# Patient Record
Sex: Male | Born: 1990 | Race: White | Hispanic: No | Marital: Single | Smoking: Never smoker
Health system: Southern US, Community
[De-identification: ages and names within clinical notes are randomized; demographics above are authoritative.]

## PROBLEM LIST (undated history)

## (undated) DIAGNOSIS — F32A Depression, unspecified: Secondary | ICD-10-CM

## (undated) DIAGNOSIS — F4310 Post-traumatic stress disorder, unspecified: Secondary | ICD-10-CM

## (undated) DIAGNOSIS — M79606 Pain in leg, unspecified: Secondary | ICD-10-CM

## (undated) DIAGNOSIS — G4700 Insomnia, unspecified: Secondary | ICD-10-CM

## (undated) DIAGNOSIS — F329 Major depressive disorder, single episode, unspecified: Secondary | ICD-10-CM

## (undated) DIAGNOSIS — F988 Other specified behavioral and emotional disorders with onset usually occurring in childhood and adolescence: Secondary | ICD-10-CM

## (undated) DIAGNOSIS — G8929 Other chronic pain: Secondary | ICD-10-CM

## (undated) DIAGNOSIS — F419 Anxiety disorder, unspecified: Secondary | ICD-10-CM

---
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
## 2009-10-11 ENCOUNTER — Ambulatory Visit: Payer: Self-pay | Admitting: Internal Medicine

## 2014-01-11 ENCOUNTER — Telehealth: Payer: Self-pay | Admitting: Internal Medicine

## 2014-01-11 NOTE — Telephone Encounter (Signed)
Have you seen this patient?

## 2014-01-11 NOTE — Telephone Encounter (Signed)
i havent seen this family for years.(last check was  2211 2010) Can contact patient and ask what his needs are  To see if we can fit him in earlier .

## 2014-01-11 NOTE — Telephone Encounter (Signed)
Pt saw md from age 89 to [age] yrs . Pt is requesting md to call him back concerning personal issue. Pt stated he goes [facility]. Pt is aware md does not have new pt appt soon

## 2014-01-11 NOTE — Telephone Encounter (Signed)
Left message at the below listed number for the pt to return my call. 

## 2014-01-13 NOTE — Telephone Encounter (Signed)
Left message at the below listed number for the pt to return my call. 

## 2014-01-15 ENCOUNTER — Emergency Department (HOSPITAL_COMMUNITY)
Admission: EM | Admit: 2014-01-15 | Discharge: 2014-01-15 | Disposition: A | Discharge status: Home / Self Care | Payer: Self-pay | Attending: Emergency Medicine | Admitting: Emergency Medicine

## 2014-01-15 ENCOUNTER — Other Ambulatory Visit: Payer: Self-pay | Admitting: Family Medicine

## 2014-01-15 ENCOUNTER — Encounter (HOSPITAL_COMMUNITY): Payer: Self-pay | Admitting: Emergency Medicine

## 2014-01-15 DIAGNOSIS — F411 Generalized anxiety disorder: Secondary | ICD-10-CM | POA: Insufficient documentation

## 2014-01-15 DIAGNOSIS — Z79899 Other long term (current) drug therapy: Secondary | ICD-10-CM | POA: Insufficient documentation

## 2014-01-15 DIAGNOSIS — F988 Other specified behavioral and emotional disorders with onset usually occurring in childhood and adolescence: Secondary | ICD-10-CM | POA: Insufficient documentation

## 2014-01-15 DIAGNOSIS — Z760 Encounter for issue of repeat prescription: Secondary | ICD-10-CM | POA: Insufficient documentation

## 2014-01-15 DIAGNOSIS — G8929 Other chronic pain: Secondary | ICD-10-CM | POA: Insufficient documentation

## 2014-01-15 HISTORY — DX: Pain in leg, unspecified: M79.606

## 2014-01-15 HISTORY — DX: Post-traumatic stress disorder, unspecified: F43.10

## 2014-01-15 HISTORY — DX: Anxiety disorder, unspecified: F41.9

## 2014-01-15 HISTORY — DX: Other specified behavioral and emotional disorders with onset usually occurring in childhood and adolescence: F98.8

## 2014-01-15 HISTORY — DX: Other chronic pain: G89.29

## 2014-01-15 MED ORDER — CLONAZEPAM 0.5 MG PO TABS: 1.0000 mg | ORAL_TABLET | Freq: Two times a day (BID) | ORAL | Status: AC | PRN

## 2014-01-15 MED ORDER — TEMAZEPAM 30 MG PO CAPS: 30.0000 mg | ORAL_CAPSULE | Freq: Every day | ORAL | Status: AC

## 2014-01-15 MED ORDER — ESZOPICLONE 3 MG PO TABS: 6.0000 mg | ORAL_TABLET | Freq: Every day | ORAL | Status: AC

## 2014-01-15 MED ORDER — METHYLPHENIDATE HCL 10 MG PO TABS: 10.0000 mg | ORAL_TABLET | Freq: Four times a day (QID) | ORAL | Status: AC

## 2014-01-15 NOTE — Telephone Encounter (Signed)
Pt notified.  Sent to [place] to schedule.

## 2014-01-15 NOTE — Discharge Instructions (Signed)
Take your medications as prescribed.  Follow up with your new primary care doctor asap.   You may return to the ER if symptoms worsen or you have any other concerns.

## 2014-01-15 NOTE — ED Notes (Signed)
Pt states he is here for medication refill on xanax, tamazapam, and lunesta and is out of his ADD medication  Ritalin IR  Pt states he has an appt with his dr on the [place]th [place] but is here with his brother on a family emergency

## 2014-01-15 NOTE — Telephone Encounter (Signed)
WP, will see the pt at her next available CPE space (no emergency appointment).  She will NOT be prescribing his controlled substances that were prescribed through the [facility] [employer] [facility].  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  If needed he can be seen in the ED for emergency refills.

## 2014-01-15 NOTE — ED Provider Notes (Signed)
Medical screening examination/treatment/procedure(s) were performed by non-physician practitioner and as supervising physician I was immediately available for consultation/collaboration.    Olivia Mackielga M Klare Criss, MD 01/15/14 [phone]

## 2014-01-15 NOTE — ED Provider Notes (Signed)
CSN: [id number]     Arrival date & time 01/15/14  0309 History   First MD Initiated Contact with Patient 01/15/14 0335     Chief Complaint  Patient presents with  . Medication Refill     (Consider location/radiation/quality/duration/timing/severity/associated sxs/prior Treatment) HPI History provided by pt.   Pt presents w/ request for medication refill.  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  Originally from [place], [place] but has recently separated from wife and moved to [place].  Is currently in [place] visiting his brother.  Has a new PCP in [place] and initial appt scheduled for 01/31/14.  Received 1 week refills of all meds at ER 8 days ago.  He is requesting his lunesta, ativan, ritalin, restoril and percocet.  Has been feeling anxious since most recent dose of ativan wore off, but otherwise feeling well.  Denies HI/SI. Past Medical History  Diagnosis Date  . PTSD (post-traumatic stress disorder)   . Chronic leg pain   . ADD (attention deficit disorder)   . Anxiety    History reviewed. No pertinent past surgical history. History reviewed. No pertinent family history. History  Substance Use Topics  . Smoking status: Never Smoker   . Smokeless tobacco: Not on file  . Alcohol Use: No    Review of Systems  All other systems reviewed and are negative.      Allergies  Review of patient's allergies indicates no known allergies.  Home Medications   Current Outpatient Rx  Name  Route  Sig  Dispense  Refill  . acetaminophen (TYLENOL) 500 MG tablet   Oral   Take 1,000 mg by mouth every 6 (six) hours as needed.         . ALPRAZolam (XANAX) 1 MG tablet   Oral   Take 1 mg by mouth 4 (four) times daily.         . Calcium-Magnesium 333-167 MG TABS   Oral   Take 1 tablet by mouth daily.         . cholecalciferol (VITAMIN D) 1000 UNITS tablet   Oral   Take 1,000 Units by mouth daily.         . DULoxetine  (CYMBALTA) 60 MG capsule   Oral   Take 60 mg by mouth daily.         Marland Kitchen Hawthorne Berry 550 MG CAPS   Oral   Take 1 capsule by mouth daily.         . Methylfol-Methylcob-Acetylcyst (CEREFOLIN NAC PO)   Oral   Take 1 tablet by mouth daily.         . Multiple Vitamin (MULTIVITAMIN WITH MINERALS) TABS tablet   Oral   Take 1 tablet by mouth daily.         Marland Kitchen oxyCODONE-acetaminophen (PERCOCET) 10-325 MG per tablet   Oral   Take 1 tablet by mouth every 4 (four) hours as needed for pain.         . clonazePAM (KLONOPIN) 0.5 MG tablet   Oral   Take 2 tablets (1 mg total) by mouth 2 (two) times daily as needed for anxiety.   8 tablet   0   . Eszopiclone (ESZOPICLONE) 3 MG TABS   Oral   Take 2 tablets (6 mg total) by mouth at bedtime. Take immediately before bedtime   14 tablet   0   . methylphenidate (RITALIN) 10 MG tablet   Oral   Take  1 tablet (10 mg total) by mouth 4 (four) times daily.   28 tablet   0   . temazepam (RESTORIL) 30 MG capsule   Oral   Take 1 capsule (30 mg total) by mouth at bedtime.   7 capsule   0    BP 136/79  Pulse 78  Temp(Src) 98.1 F (36.7 C) (Oral)  Resp 16  SpO2 100% Physical Exam  Nursing note and vitals reviewed. Constitutional: He is oriented to person, place, and time. He appears well-developed and well-nourished. No distress.  HENT:  Head: Normocephalic and atraumatic.  Eyes:  Normal appearance  Neck: Normal range of motion.  Pulmonary/Chest: Effort normal.  Musculoskeletal: Normal range of motion.  Neurological: He is alert and oriented to person, place, and time.  Psychiatric: His behavior is normal.  Anxious appearing.  Tangential speech.    ED Course  Procedures (including critical care time) Labs Review Labs Reviewed - No data to display Imaging Review No results found.  EKG Interpretation   None       MDM   Final diagnoses:  Medication refill    23yo former marine w/ PTSD, anxiety, ADD and chronic  pain presents w/ request for medication refill.  Visiting from [place] where he recently relocated, and first appt w/ new PCP 01/31/14.  Because it is the middle of the night, I can not contact his former physician to verify that he takes these medications, and he uses [facility].  I agreed to give him 8 klonopin (he has alternated between this medication and ativan in the past; usually takes 1mg  qid).  Prescribed 1 week of restoril, lunesta and ritalin as well. Declined request for percocet.  He understands that we do not treat chronic anxiety in the ER and that he will likely not have a benzo refilled if he returns.  He denies SI/HI. 4:29 AM   Otilio Miuatherine E Amyr Sluder, PA-C 01/15/14 [phone]

## 2014-03-17 ENCOUNTER — Ambulatory Visit: Payer: Self-pay | Admitting: Internal Medicine

## 2014-03-17 DIAGNOSIS — Z0289 Encounter for other administrative examinations: Secondary | ICD-10-CM

## 2014-03-22 ENCOUNTER — Emergency Department (HOSPITAL_COMMUNITY): Admission: EM | Admit: 2014-03-22 | Discharge: 2014-03-22 | Disposition: A | Discharge status: Home / Self Care

## 2015-01-06 ENCOUNTER — Emergency Department (HOSPITAL_COMMUNITY)

## 2015-01-06 ENCOUNTER — Encounter (HOSPITAL_COMMUNITY): Payer: Self-pay | Admitting: Emergency Medicine

## 2015-01-06 ENCOUNTER — Emergency Department (HOSPITAL_COMMUNITY)
Admission: EM | Admit: 2015-01-06 | Discharge: 2015-01-06 | Disposition: A | Discharge status: Home / Self Care | Attending: Emergency Medicine | Admitting: Emergency Medicine

## 2015-01-06 DIAGNOSIS — M7989 Other specified soft tissue disorders: Secondary | ICD-10-CM

## 2015-01-06 DIAGNOSIS — S60221A Contusion of right hand, initial encounter: Secondary | ICD-10-CM | POA: Insufficient documentation

## 2015-01-06 DIAGNOSIS — W208XXA Other cause of strike by thrown, projected or falling object, initial encounter: Secondary | ICD-10-CM | POA: Insufficient documentation

## 2015-01-06 DIAGNOSIS — Y998 Other external cause status: Secondary | ICD-10-CM | POA: Insufficient documentation

## 2015-01-06 DIAGNOSIS — F419 Anxiety disorder, unspecified: Secondary | ICD-10-CM | POA: Insufficient documentation

## 2015-01-06 DIAGNOSIS — S6991XA Unspecified injury of right wrist, hand and finger(s), initial encounter: Secondary | ICD-10-CM | POA: Diagnosis present

## 2015-01-06 DIAGNOSIS — Z79899 Other long term (current) drug therapy: Secondary | ICD-10-CM | POA: Diagnosis not present

## 2015-01-06 DIAGNOSIS — G8929 Other chronic pain: Secondary | ICD-10-CM | POA: Insufficient documentation

## 2015-01-06 DIAGNOSIS — Y9389 Activity, other specified: Secondary | ICD-10-CM | POA: Diagnosis not present

## 2015-01-06 DIAGNOSIS — F4310 Post-traumatic stress disorder, unspecified: Secondary | ICD-10-CM | POA: Insufficient documentation

## 2015-01-06 DIAGNOSIS — Y9289 Other specified places as the place of occurrence of the external cause: Secondary | ICD-10-CM | POA: Diagnosis not present

## 2015-01-06 MED ORDER — IBUPROFEN 400 MG PO TABS
600.0000 mg | ORAL_TABLET | Freq: Once | ORAL | Status: AC
  Administered 2015-01-06: 600 mg via ORAL
  Filled 2015-01-06 (×2): qty 1

## 2015-01-06 MED ORDER — IBUPROFEN 600 MG PO TABS
600.0000 mg | ORAL_TABLET | Freq: Four times a day (QID) | ORAL | Status: DC | PRN
Start: 2015-01-06 — End: 2015-01-15

## 2015-01-06 MED ORDER — OXYCODONE-ACETAMINOPHEN 5-325 MG PO TABS
1.0000 | ORAL_TABLET | Freq: Once | ORAL | Status: AC
  Administered 2015-01-06: 1 via ORAL
  Filled 2015-01-06: qty 1

## 2015-01-06 MED ORDER — OXYCODONE-ACETAMINOPHEN 10-325 MG PO TABS: 1.0000 | ORAL_TABLET | ORAL | Status: DC | PRN

## 2015-01-06 NOTE — Discharge Instructions (Signed)
Be sure to keep your hand elevated above your heart to help reduce swelling.  You may remove your arm from your sling to help stretch your elbow and shoulder but make sure your hand is kept elevated as often as possible.  Your should use a pillow at night to help prop it up for comfort and to help with swelling.  You may ice your left hand 3-4 times every hours for 15-20 minutes at a time. Be sure to use a thin cloth between ice and skin to prevent skin damage.  See below for further instructions.   Contusion A contusion is a deep bruise. Contusions are the result of an injury that caused bleeding under the skin. The contusion may turn blue, purple, or yellow. Minor injuries will give you a painless contusion, but more severe contusions may stay painful and swollen for a few weeks.  CAUSES  A contusion is usually caused by a blow, trauma, or direct force to an area of the body. SYMPTOMS   Swelling and redness of the injured area.  Bruising of the injured area.  Tenderness and soreness of the injured area.  Pain. DIAGNOSIS  The diagnosis can be made by taking a history and physical exam. An X-ray, CT scan, or MRI may be needed to determine if there were any associated injuries, such as fractures. TREATMENT  Specific treatment will depend on what area of the body was injured. In general, the best treatment for a contusion is resting, icing, elevating, and applying cold compresses to the injured area. Over-the-counter medicines may also be recommended for pain control. Ask your caregiver what the best treatment is for your contusion. HOME CARE INSTRUCTIONS   Put ice on the injured area.  Put ice in a plastic bag.  Place a towel between your skin and the bag.  Leave the ice on for 15-20 minutes, 3-4 times a day, or as directed by your health care provider.  Only take over-the-counter or prescription medicines for pain, discomfort, or fever as directed by your caregiver. Your caregiver may  recommend avoiding anti-inflammatory medicines (aspirin, ibuprofen, and naproxen) for 48 hours because these medicines may increase bruising.  Rest the injured area.  If possible, elevate the injured area to reduce swelling. SEEK IMMEDIATE MEDICAL CARE IF:   You have increased bruising or swelling.  You have pain that is getting worse.  Your swelling or pain is not relieved with medicines. MAKE SURE YOU:  Cryotherapy Cryotherapy means treatment with cold. Ice or gel packs can be used to reduce both pain and swelling. Ice is the most helpful within the first 24 to 48 hours after an injury or flare-up from overusing a muscle or joint. Sprains, strains, spasms, burning pain, shooting pain, and aches can all be eased with ice. Ice can also be used when recovering from surgery. Ice is effective, has very few side effects, and is safe for most people to use. PRECAUTIONS  Ice is not a safe treatment option for people with:  Raynaud phenomenon. This is a condition affecting small blood vessels in the extremities. Exposure to cold may cause your problems to return.  Cold hypersensitivity. There are many forms of cold hypersensitivity, including:  Cold urticaria. Red, itchy hives appear on the skin when the tissues begin to warm after being iced.  Cold erythema. This is a red, itchy rash caused by exposure to cold.  Cold hemoglobinuria. Red blood cells break down when the tissues begin to warm after being iced.  The hemoglobin that carry oxygen are passed into the urine because they cannot combine with blood proteins fast enough.  Numbness or altered sensitivity in the area being iced. If you have any of the following conditions, do not use ice until you have discussed cryotherapy with your caregiver:  Heart conditions, such as arrhythmia, angina, or chronic heart disease.  High blood pressure.  Healing wounds or open skin in the area being iced.  Current infections.  Rheumatoid  arthritis.  Poor circulation.  Diabetes. Ice slows the blood flow in the region it is applied. This is beneficial when trying to stop inflamed tissues from spreading irritating chemicals to surrounding tissues. However, if you expose your skin to cold temperatures for too long or without the proper protection, you can damage your skin or nerves. Watch for signs of skin damage due to cold. HOME CARE INSTRUCTIONS Follow these tips to use ice and cold packs safely.  Place a dry or damp towel between the ice and skin. A damp towel will cool the skin more quickly, so you may need to shorten the time that the ice is used.  For a more rapid response, add gentle compression to the ice.  Ice for no more than 10 to 20 minutes at a time. The bonier the area you are icing, the less time it will take to get the benefits of ice.  Check your skin after 5 minutes to make sure there are no signs of a poor response to cold or skin damage.  Rest 20 minutes or more between uses.  Once your skin is numb, you can end your treatment. You can test numbness by very lightly touching your skin. The touch should be so light that you do not see the skin dimple from the pressure of your fingertip. When using ice, most people will feel these normal sensations in this order: cold, burning, aching, and numbness.  Do not use ice on someone who cannot communicate their responses to pain, such as small children or people with dementia. HOW TO MAKE AN ICE PACK Ice packs are the most common way to use ice therapy. Other methods include ice massage, ice baths, and cryosprays. Muscle creams that cause a cold, tingly feeling do not offer the same benefits that ice offers and should not be used as a substitute unless recommended by your caregiver. To make an ice pack, do one of the following:  Place crushed ice or a bag of frozen vegetables in a sealable plastic bag. Squeeze out the excess air. Place this bag inside another plastic  bag. Slide the bag into a pillowcase or place a damp towel between your skin and the bag.  Mix 3 parts water with 1 part rubbing alcohol. Freeze the mixture in a sealable plastic bag. When you remove the mixture from the freezer, it will be slushy. Squeeze out the excess air. Place this bag inside another plastic bag. Slide the bag into a pillowcase or place a damp towel between your skin and the bag. SEEK MEDICAL CARE IF:  You develop white spots on your skin. This may give the skin a blotchy (mottled) appearance.  Your skin turns blue or pale.  Your skin becomes waxy or hard.  Your swelling gets worse. MAKE SURE YOU:   Understand these instructions.  Will watch your condition.  Will get help right away if you are not doing well or get worse. Document Released: 07/02/2011 Document Revised: 03/22/2014 Document Reviewed: 07/02/2011 [employer] Patient Information  2015 [employer]. This information is not intended to replace advice given to you by your health care provider. Make sure you discuss any questions you have with your health care provider.   Understand these instructions.  Will watch your condition.  Will get help right away if you are not doing well or get worse. Document Released: 08/15/2005 Document Revised: 11/10/2013 Document Reviewed: 09/10/2011 [facility] Patient Information 2015 [place], [place]. This information is not intended to replace advice given to you by your health care provider. Make sure you discuss any questions you have with your health care provider.

## 2015-01-06 NOTE — ED Notes (Signed)
Pt. accidentally dropped a dumbell at his right hand this evening , presents with swelling / pain .

## 2015-01-06 NOTE — ED Provider Notes (Signed)
CSN: [id number]     Arrival date & time 01/06/15  1846 History  This chart was scribed for non-physician practitioner, Junius FinnerErin O'Malley, PA-C, working with Flint MelterElliott L Wentz, MD, by Bronson CurbJacqueline Melvin, ED Scribe. This patient was seen in room TR06C/TR06C and the patient's care was started at 8:32 PM.    Chief Complaint  Patient presents with  . Hand Injury    The history is provided by the patient. No language interpreter was used.     HPI Comments: Charles Barber is a 24 y.o. male who presents to the Emergency Department complaining of right hand injury that occurred approximately 3 hours ago. Patient states he accidentally dropped a 90 pound dumbbell on his right hand. He reports the hand was stuck under the weight for a few seconds before his brother removed it. There is associated 10/10, aching pain, redness, and swelling. He reports the pain is worse near the middle finger of the right hand. He denies numbness/tingling, wound, or any other injuries. Patient is right hand dominant.    Past Medical History  Diagnosis Date  . PTSD (post-traumatic stress disorder)   . Chronic leg pain   . ADD (attention deficit disorder)   . Anxiety    History reviewed. No pertinent past surgical history. No family history on file. History  Substance Use Topics  . Smoking status: Never Smoker   . Smokeless tobacco: Not on file  . Alcohol Use: No    Review of Systems  Musculoskeletal: Positive for myalgias.       Right had swelling.  Skin: Positive for color change. Negative for wound.  Neurological: Negative for numbness.      Allergies  Review of patient's allergies indicates no known allergies.  Home Medications   Prior to Admission medications   Medication Sig Start Date End Date Taking? Authorizing Provider  acetaminophen (TYLENOL) 500 MG tablet Take 1,000 mg by mouth every 6 (six) hours as needed.    Historical Provider, MD  ALPRAZolam Prudy Feeler(XANAX) 1 MG tablet Take 1 mg by mouth 4 (four)  times daily.    Historical Provider, MD  Calcium-Magnesium 333-167 MG TABS Take 1 tablet by mouth daily.    Historical Provider, MD  cholecalciferol (VITAMIN D) 1000 UNITS tablet Take 1,000 Units by mouth daily.    Historical Provider, MD  clonazePAM (KLONOPIN) 0.5 MG tablet Take 2 tablets (1 mg total) by mouth 2 (two) times daily as needed for anxiety. 01/15/14   Arie Sabinaatherine E Schinlever, PA-C  DULoxetine (CYMBALTA) 60 MG capsule Take 60 mg by mouth daily.    Historical Provider, MD  Eszopiclone (ESZOPICLONE) 3 MG TABS Take 2 tablets (6 mg total) by mouth at bedtime. Take immediately before bedtime 01/15/14   Arie Sabinaatherine E Schinlever, PA-C  Hawthorne Berry 550 MG CAPS Take 1 capsule by mouth daily.    Historical Provider, MD  ibuprofen (ADVIL,MOTRIN) 600 MG tablet Take 1 tablet (600 mg total) by mouth every 6 (six) hours as needed. 01/06/15   Junius FinnerErin O'Malley, PA-C  Methylfol-Methylcob-Acetylcyst (CEREFOLIN NAC PO) Take 1 tablet by mouth daily.    Historical Provider, MD  methylphenidate (RITALIN) 10 MG tablet Take 1 tablet (10 mg total) by mouth 4 (four) times daily. 01/15/14   Arie Sabinaatherine E Schinlever, PA-C  Multiple Vitamin (MULTIVITAMIN WITH MINERALS) TABS tablet Take 1 tablet by mouth daily.    Historical Provider, MD  oxyCODONE-acetaminophen (PERCOCET) 10-325 MG per tablet Take 1 tablet by mouth every 4 (four) hours as needed for pain. 01/06/15  Junius Finner, PA-C  temazepam (RESTORIL) 30 MG capsule Take 1 capsule (30 mg total) by mouth at bedtime. 01/15/14   Arie Sabina Schinlever, PA-C   Triage Vitals: BP 135/81 mmHg  Pulse 85  Temp(Src) 98.2 F (36.8 C) (Oral)  Resp 18  Ht  (1.753 m)  Wt 175 lb (79.379 kg)  BMI 25.83 kg/m2  SpO2 95%  Physical Exam  Constitutional: He is oriented to person, place, and time. He appears well-developed and well-nourished.  HENT:  Head: Normocephalic and atraumatic.  Eyes: EOM are normal.  Neck: Normal range of motion.  Cardiovascular: Normal rate.    Pulses:      Radial pulses are 2+ on the right side.  Cap refill < 3 seconds in all 5 fingers of the right hand.  Pulmonary/Chest: Effort normal.  Musculoskeletal: Normal range of motion.  Significant edema to dorsal aspect of right hand with tenderness. Near full flexion of fingers. Limited reduction and movement due to pain and swelling. No tenderness to wrist or digits of hand.  Neurological: He is alert and oriented to person, place, and time.  Sensation to light and sharp touch intact in right hand.  Skin: Skin is warm and dry.  Skin intact. No ecchymosis. Mild erythema, but no warmth.  Psychiatric: He has a normal mood and affect. His behavior is normal.  Nursing note and vitals reviewed.   ED Course  Procedures (including critical care time)  DIAGNOSTIC STUDIES: Oxygen Saturation is 95% on room air, adequate by my interpretation.    COORDINATION OF CARE: At 2034 Discussed treatment plan with patient which includes imaging results and pain medication. Patient agrees.   Labs Review Labs Reviewed - No data to display  Imaging Review Dg Hand Complete Right  01/06/2015   CLINICAL DATA:  Pt states that he dropped a 50 pound weight onto his right hand this evening, right hand pains with swelling mostly over the 3rd MCP  EXAM: RIGHT HAND - COMPLETE 3+ VIEW  COMPARISON:  None.  FINDINGS: No fracture. Joints normally spaced and aligned. No arthropathic change.  There is dorsal soft tissue swelling. No soft tissue air. No radiopaque foreign body.  IMPRESSION: Dorsal soft tissue swelling.  No fracture or dislocation.   Electronically Signed   By: Amie Portland M.D.   On: 01/06/2015 20:43     EKG Interpretation None      MDM   Final diagnoses:  Hand contusion, right, initial encounter  Swelling of right hand    Pt presenting to ED with significant edema to right hand after dropping a 90lb dumbell on his hand.  Right hand is neurovascularly in tact.  Plain films: no fracture or  dislocation. Significant for dorsal soft tissue swelling.   Discussed pt with Dr. Effie Shy, low concern for compartment syndrome at this time. Encouraged elevation of right hand, applied sling to help keep hand elevated. Also encouraged cryotherapy several times a day. Rx: ibuprofen and percocet. Home care instructions provided. Strict return precautions provided. Advised to f/u with Dr. Amanda Pea or PCP as needed for recheck of symptoms if not improving. Pt verbalized understanding and agreement with tx plan.  I personally performed the services described in this documentation, which was scribed in my presence. The recorded information has been reviewed and is accurate.   Junius Finner, PA-C 01/07/15 1610  Flint Melter, MD 01/07/15 [phone]

## 2015-01-15 ENCOUNTER — Encounter (HOSPITAL_COMMUNITY): Payer: Self-pay | Admitting: Emergency Medicine

## 2015-01-15 ENCOUNTER — Emergency Department (HOSPITAL_COMMUNITY)
Admission: EM | Admit: 2015-01-15 | Discharge: 2015-01-15 | Disposition: A | Discharge status: Home / Self Care | Attending: Emergency Medicine | Admitting: Emergency Medicine

## 2015-01-15 DIAGNOSIS — Y288XXA Contact with other sharp object, undetermined intent, initial encounter: Secondary | ICD-10-CM | POA: Insufficient documentation

## 2015-01-15 DIAGNOSIS — T07XXXA Unspecified multiple injuries, initial encounter: Secondary | ICD-10-CM

## 2015-01-15 DIAGNOSIS — F419 Anxiety disorder, unspecified: Secondary | ICD-10-CM | POA: Diagnosis not present

## 2015-01-15 DIAGNOSIS — G8929 Other chronic pain: Secondary | ICD-10-CM | POA: Insufficient documentation

## 2015-01-15 DIAGNOSIS — S6992XA Unspecified injury of left wrist, hand and finger(s), initial encounter: Secondary | ICD-10-CM | POA: Diagnosis present

## 2015-01-15 DIAGNOSIS — Y9389 Activity, other specified: Secondary | ICD-10-CM | POA: Insufficient documentation

## 2015-01-15 DIAGNOSIS — F909 Attention-deficit hyperactivity disorder, unspecified type: Secondary | ICD-10-CM | POA: Diagnosis not present

## 2015-01-15 DIAGNOSIS — S51812A Laceration without foreign body of left forearm, initial encounter: Secondary | ICD-10-CM | POA: Diagnosis not present

## 2015-01-15 DIAGNOSIS — Y998 Other external cause status: Secondary | ICD-10-CM | POA: Insufficient documentation

## 2015-01-15 DIAGNOSIS — S66922A Laceration of unspecified muscle, fascia and tendon at wrist and hand level, left hand, initial encounter: Secondary | ICD-10-CM | POA: Insufficient documentation

## 2015-01-15 DIAGNOSIS — Z79899 Other long term (current) drug therapy: Secondary | ICD-10-CM | POA: Diagnosis not present

## 2015-01-15 DIAGNOSIS — Y92009 Unspecified place in unspecified non-institutional (private) residence as the place of occurrence of the external cause: Secondary | ICD-10-CM | POA: Diagnosis not present

## 2015-01-15 DIAGNOSIS — S61512A Laceration without foreign body of left wrist, initial encounter: Secondary | ICD-10-CM

## 2015-01-15 MED ORDER — LIDOCAINE-EPINEPHRINE (PF) 2 %-1:200000 IJ SOLN
INTRAMUSCULAR | Status: AC
  Filled 2015-01-15: qty 20

## 2015-01-15 MED ORDER — CEPHALEXIN 500 MG PO CAPS: 500.0000 mg | ORAL_CAPSULE | Freq: Three times a day (TID) | ORAL | Status: AC

## 2015-01-15 MED ORDER — KETOROLAC TROMETHAMINE 60 MG/2ML IM SOLN
60.0000 mg | Freq: Once | INTRAMUSCULAR | Status: AC
  Administered 2015-01-15: 60 mg via INTRAMUSCULAR
  Filled 2015-01-15: qty 2

## 2015-01-15 MED ORDER — IBUPROFEN 800 MG PO TABS: 800.0000 mg | ORAL_TABLET | Freq: Once | ORAL | Status: DC

## 2015-01-15 MED ORDER — LIDOCAINE-EPINEPHRINE (PF) 2 %-1:200000 IJ SOLN
10.0000 mL | Freq: Once | INTRAMUSCULAR | Status: AC
  Administered 2015-01-15: 10 mL via INTRADERMAL
  Filled 2015-01-15: qty 10

## 2015-01-15 MED ORDER — IBUPROFEN 600 MG PO TABS: 600.0000 mg | ORAL_TABLET | Freq: Four times a day (QID) | ORAL | Status: AC | PRN

## 2015-01-15 NOTE — ED Provider Notes (Signed)
CSN: [id number]     Arrival date & time 01/15/15  0249 History   First MD Initiated Contact with Patient 01/15/15 [phone]     Chief Complaint  Patient presents with  . Laceration     (Consider location/radiation/quality/duration/timing/severity/associated sxs/prior Treatment) HPI Charles Barber is a 24 y.o. male with no medical problems, presents to emergency department complaining of lacerations to the left forearm. Patient states he accidentally put his left arm through the house window. Multiple lacerations to the anterior forearm, bleeding. Patient denies any numbness or weakness to the hand distally. He states that his lacerations are very painful. Pain is worse with palpation of the injured area. No other injuries. Tetanus updated last year. Pt is right handed.  Pt admits to alcohol intake at time of the injury. Brought in by his brother.   Past Medical History  Diagnosis Date  . PTSD (post-traumatic stress disorder)   . Chronic leg pain   . ADD (attention deficit disorder)   . Anxiety    History reviewed. No pertinent past surgical history. No family history on file. History  Substance Use Topics  . Smoking status: Never Smoker   . Smokeless tobacco: Not on file  . Alcohol Use: Yes    Review of Systems  Constitutional: Negative for fever and chills.  Respiratory: Negative for cough, chest tightness and shortness of breath.   Cardiovascular: Negative for chest pain, palpitations and leg swelling.  Musculoskeletal: Negative for myalgias and arthralgias.  Skin: Positive for wound. Negative for rash.  Allergic/Immunologic: Negative for immunocompromised state.  Neurological: Negative for dizziness, weakness and numbness.  All other systems reviewed and are negative.     Allergies  Review of patient's allergies indicates no known allergies.  Home Medications   Prior to Admission medications   Medication Sig Start Date End Date Taking? Authorizing Provider   Calcium-Magnesium 333-167 MG TABS Take 1 tablet by mouth daily.   Yes Historical Provider, MD  cholecalciferol (VITAMIN D) 1000 UNITS tablet Take 1,000 Units by mouth daily.   Yes Historical Provider, MD  DULoxetine (CYMBALTA) 60 MG capsule Take 60 mg by mouth daily.   Yes Historical Provider, MD  Eszopiclone (ESZOPICLONE) 3 MG TABS Take 2 tablets (6 mg total) by mouth at bedtime. Take immediately before bedtime 01/15/14  Yes Arie Sabina Schinlever, PA-C  Hawthorne Berry 550 MG CAPS Take 1 capsule by mouth daily.   Yes Historical Provider, MD  Methylfol-Methylcob-Acetylcyst (CEREFOLIN NAC PO) Take 1 tablet by mouth daily.   Yes Historical Provider, MD  methylphenidate (RITALIN) 10 MG tablet Take 1 tablet (10 mg total) by mouth 4 (four) times daily. 01/15/14  Yes Catherine E Schinlever, PA-C  Multiple Vitamin (MULTIVITAMIN WITH MINERALS) TABS tablet Take 1 tablet by mouth daily.   Yes Historical Provider, MD  oxyCODONE-acetaminophen (PERCOCET) 10-325 MG per tablet Take 1 tablet by mouth every 4 (four) hours as needed for pain. 01/06/15  Yes Junius Finner, PA-C  acetaminophen (TYLENOL) 500 MG tablet Take 1,000 mg by mouth every 6 (six) hours as needed.    Historical Provider, MD  ALPRAZolam Prudy Feeler) 1 MG tablet Take 1 mg by mouth 4 (four) times daily.    Historical Provider, MD  clonazePAM (KLONOPIN) 0.5 MG tablet Take 2 tablets (1 mg total) by mouth 2 (two) times daily as needed for anxiety. 01/15/14   Arie Sabina Schinlever, PA-C  ibuprofen (ADVIL,MOTRIN) 600 MG tablet Take 1 tablet (600 mg total) by mouth every 6 (six) hours as needed. 01/06/15  Junius Finner, PA-C  temazepam (RESTORIL) 30 MG capsule Take 1 capsule (30 mg total) by mouth at bedtime. 01/15/14   Arie Sabina Schinlever, PA-C   BP 130/74 mmHg  Pulse 110  Temp(Src) 98.4 F (36.9 C) (Oral)  Resp 18  Ht  (1.753 m)  Wt 175 lb (79.379 kg)  BMI 25.83 kg/m2  SpO2 97% Physical Exam  Constitutional: He is oriented to person, place, and  time. He appears well-developed and well-nourished. No distress.  Eyes: Conjunctivae are normal.  Neck: Neck supple.  Cardiovascular: Normal rate, regular rhythm and normal heart sounds.   Pulmonary/Chest: Effort normal and breath sounds normal. No respiratory distress. He has no wheezes. He has no rales.  Musculoskeletal:  Multiple lacerations to the left anterior distal forearm, most measuring between 1-2 cm. A larger laceration, 3cm, to the ulnar aspect of the distal anterior forearm, just proximal to the wrist joint. Laceration appears to be deep and involveflexor tendon. Patient has full range of motion of the wrist with flexion and extension against resistance. Full range of motion of all digits, with flexion and extension at all joints. Patient is able to spread all fingers. Patient is able to make a fist. Sensation is intact distally in all fingers. Cap refill less than 2 seconds distally in all fingers.  Neurological: He is alert and oriented to person, place, and time.  Nursing note and vitals reviewed.   ED Course  Procedures (including critical care time) Labs Review Labs Reviewed - No data to display  Imaging Review No results found.   EKG Interpretation None      LACERATION REPAIR Performed by: Lottie Mussel Authorized by: Jaynie Crumble A Consent: Verbal consent obtained. Risks and benefits: risks, benefits and alternatives were discussed Consent given by: patient Patient identity confirmed: provided demographic data Prepped and Draped in normal sterile fashion Wound explored  Laceration Location: left forearm  Laceration Length: 1cm  No Foreign Bodies seen or palpated  Anesthesia: local infiltration  Local anesthetic: lidocaine 2% w epinephrine  Anesthetic total: 2 ml  Irrigation method: syringe Amount of cleaning: standard  Skin closure: prolene 5.0  Number of sutures: 2  Technique: simple interrupted  Patient tolerance: Patient  tolerated the procedure well with no immediate complications.   LACERATION REPAIR Performed by: Lottie Mussel Authorized by: Jaynie Crumble A Consent: Verbal consent obtained. Risks and benefits: risks, benefits and alternatives were discussed Consent given by: patient Patient identity confirmed: provided demographic data Prepped and Draped in normal sterile fashion Wound explored  Laceration Location: left forearm  Laceration Length: 1cm  No Foreign Bodies seen or palpated  Anesthesia: local infiltration  Local anesthetic: lidocaine 2% w epinephrine  Anesthetic total: 2 ml  Irrigation method: syringe Amount of cleaning: standard  Skin closure: prolene 5.0  Number of sutures: 2  Technique: simple interrupted  Patient tolerance: Patient tolerated the procedure well with no immediate complications.  LACERATION REPAIR Performed by: Lottie Mussel Authorized by: Jaynie Crumble A Consent: Verbal consent obtained. Risks and benefits: risks, benefits and alternatives were discussed Consent given by: patient Patient identity confirmed: provided demographic data Prepped and Draped in normal sterile fashion Wound explored  Laceration Location: left forearm  Laceration Length: 1cm  No Foreign Bodies seen or palpated  Anesthesia: local infiltration  Local anesthetic: lidocaine 2% w epinephrine  Anesthetic total:1 ml  Irrigation method: syringe Amount of cleaning: standard  Skin closure: prolene 5.0  Number of sutures: 1 Technique: simple interrupted  Patient  tolerance: Patient tolerated the procedure well with no immediate complications.   LACERATION REPAIR Performed by: Lottie MusselKIRICHENKO, Mamie Hundertmark A Authorized by: Jaynie CrumbleKIRICHENKO, Lunette Tapp A Consent: Verbal consent obtained. Risks and benefits: risks, benefits and alternatives were discussed Consent given by: patient Patient identity confirmed: provided demographic data Prepped and Draped in  normal sterile fashion Wound explored  Laceration Location: left forearm  Laceration Length: 1cm  No Foreign Bodies seen or palpated, laceration of tendon noted  Anesthesia: local infiltration  Local anesthetic: lidocaine 2% w epinephrine  Anesthetic total: 3ml  Irrigation method: syringe Amount of cleaning: standard  Skin closure: prolene 5.0  Number of sutures: 4  Technique: simple interrupted  Patient tolerance: Patient tolerated the procedure well with no immediate complications. MDM   Final diagnoses:  Laceration of left wrist with tendon involvement, initial encounter  Multiple lacerations     7:21 AM Left wrist laceration with tendon involvement. Neurovascularly intact. Discussed with Dr. Mina MarbleWeingold. Will close laceration. Pt will follow up in the office on Monday. Advised ACE wrap. Laceration repaired. Wounds closed. Pt will be d/c home with ace wrap. Bacitracin topically, keflex, follow up with Dr. Mina MarbleWeingold.   Filed Vitals:   01/15/15 0305  BP: 130/74  Pulse: 110  Temp: 98.4 F (36.9 C)  TempSrc: Oral  Resp: 18  Height: 5\' 9"  (1.753 m)  Weight: 175 lb (79.379 kg)  SpO2: 97%        Lottie Musselatyana A Annalei Friesz, PA-C 01/15/15 0912  Doug SouSam Jacubowitz, MD 01/15/15 [phone]
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
## 2015-01-15 NOTE — ED Notes (Addendum)
Pt states he accidentally put L arm through house window. Multiple jagged laceration noted to L forearm, possible severed tendon visible. PA aware.

## 2015-01-15 NOTE — ED Notes (Signed)
PA at bedside.

## 2015-01-15 NOTE — Discharge Instructions (Signed)
Ibuprofen for for pain. ACE wrap for compression. Keflex to prevent infection. Follow up with Dr. Mina MarbleWeingold on Monday, call in the morning for an appointment. Apply bacitracin topically twice a day. Keep all wounds clean. Suture removal in 10 days.    Laceration Care, Adult A laceration is a cut or lesion that goes through all layers of the skin and into the tissue just beneath the skin. TREATMENT  Some lacerations may not require closure. Some lacerations may not be able to be closed due to an increased risk of infection. It is important to see your caregiver as soon as possible after an injury to minimize the risk of infection and maximize the opportunity for successful closure. If closure is appropriate, pain medicines may be given, if needed. The wound will be cleaned to help prevent infection. Your caregiver will use stitches (sutures), staples, wound glue (adhesive), or skin adhesive strips to repair the laceration. These tools bring the skin edges together to allow for faster healing and a better cosmetic outcome. However, all wounds will heal with a scar. Once the wound has healed, scarring can be minimized by covering the wound with sunscreen during the day for 1 full year. HOME CARE INSTRUCTIONS  For sutures or staples:  Keep the wound clean and dry.  If you were given a bandage (dressing), you should change it at least once a day. Also, change the dressing if it becomes wet or dirty, or as directed by your caregiver.  Wash the wound with soap and water 2 times a day. Rinse the wound off with water to remove all soap. Pat the wound dry with a clean towel.  After cleaning, apply a thin layer of the antibiotic ointment as recommended by your caregiver. This will help prevent infection and keep the dressing from sticking.  You may shower as usual after the first 24 hours. Do not soak the wound in water until the sutures are removed.  Only take over-the-counter or prescription medicines for  pain, discomfort, or fever as directed by your caregiver.  Get your sutures or staples removed as directed by your caregiver. For skin adhesive strips:  Keep the wound clean and dry.  Do not get the skin adhesive strips wet. You may bathe carefully, using caution to keep the wound dry.  If the wound gets wet, pat it dry with a clean towel.  Skin adhesive strips will fall off on their own. You may trim the strips as the wound heals. Do not remove skin adhesive strips that are still stuck to the wound. They will fall off in time. For wound adhesive:  You may briefly wet your wound in the shower or bath. Do not soak or scrub the wound. Do not swim. Avoid periods of heavy perspiration until the skin adhesive has fallen off on its own. After showering or bathing, gently pat the wound dry with a clean towel.  Do not apply liquid medicine, cream medicine, or ointment medicine to your wound while the skin adhesive is in place. This may loosen the film before your wound is healed.  If a dressing is placed over the wound, be careful not to apply tape directly over the skin adhesive. This may cause the adhesive to be pulled off before the wound is healed.  Avoid prolonged exposure to sunlight or tanning lamps while the skin adhesive is in place. Exposure to ultraviolet light in the first year will darken the scar.  The skin adhesive will usually remain in  place for 5 to 10 days, then naturally fall off the skin. Do not pick at the adhesive film. You may need a tetanus shot if:  You cannot remember when you had your last tetanus shot.  You have never had a tetanus shot. If you get a tetanus shot, your arm may swell, get red, and feel warm to the touch. This is common and not a problem. If you need a tetanus shot and you choose not to have one, there is a rare chance of getting tetanus. Sickness from tetanus can be serious. SEEK MEDICAL CARE IF:   You have redness, swelling, or increasing pain in  the wound.  You see a red line that goes away from the wound.  You have yellowish-white fluid (pus) coming from the wound.  You have a fever.  You notice a bad smell coming from the wound or dressing.  Your wound breaks open before or after sutures have been removed.  You notice something coming out of the wound such as wood or glass.  Your wound is on your hand or foot and you cannot move a finger or toe. SEEK IMMEDIATE MEDICAL CARE IF:   Your pain is not controlled with prescribed medicine.  You have severe swelling around the wound causing pain and numbness or a change in color in your arm, hand, leg, or foot.  Your wound splits open and starts bleeding.  You have worsening numbness, weakness, or loss of function of any joint around or beyond the wound.  You develop painful lumps near the wound or on the skin anywhere on your body. MAKE SURE YOU:   Understand these instructions.  Will watch your condition.  Will get help right away if you are not doing well or get worse. Document Released: 11/05/2005 Document Revised: 01/28/2012 Document Reviewed: 05/01/2011 [facility] Patient Information 2015 [place], [place]. This information is not intended to replace advice given to you by your health care provider. Make sure you discuss any questions you have with your health care provider.

## 2015-02-03 ENCOUNTER — Emergency Department (HOSPITAL_COMMUNITY)
Admission: EM | Admit: 2015-02-03 | Discharge: 2015-02-04 | Disposition: A | Discharge status: Home / Self Care | Attending: Emergency Medicine | Admitting: Emergency Medicine

## 2015-02-03 ENCOUNTER — Emergency Department (HOSPITAL_COMMUNITY)

## 2015-02-03 ENCOUNTER — Encounter (HOSPITAL_COMMUNITY): Payer: Self-pay | Admitting: Emergency Medicine

## 2015-02-03 DIAGNOSIS — R410 Disorientation, unspecified: Secondary | ICD-10-CM | POA: Diagnosis not present

## 2015-02-03 DIAGNOSIS — S00411A Abrasion of right ear, initial encounter: Secondary | ICD-10-CM | POA: Insufficient documentation

## 2015-02-03 DIAGNOSIS — G8929 Other chronic pain: Secondary | ICD-10-CM | POA: Insufficient documentation

## 2015-02-03 DIAGNOSIS — S060X9A Concussion with loss of consciousness of unspecified duration, initial encounter: Secondary | ICD-10-CM | POA: Insufficient documentation

## 2015-02-03 DIAGNOSIS — Y998 Other external cause status: Secondary | ICD-10-CM | POA: Insufficient documentation

## 2015-02-03 DIAGNOSIS — Y9389 Activity, other specified: Secondary | ICD-10-CM | POA: Diagnosis not present

## 2015-02-03 DIAGNOSIS — R42 Dizziness and giddiness: Secondary | ICD-10-CM | POA: Insufficient documentation

## 2015-02-03 DIAGNOSIS — Z792 Long term (current) use of antibiotics: Secondary | ICD-10-CM | POA: Insufficient documentation

## 2015-02-03 DIAGNOSIS — Z79899 Other long term (current) drug therapy: Secondary | ICD-10-CM | POA: Insufficient documentation

## 2015-02-03 DIAGNOSIS — Y929 Unspecified place or not applicable: Secondary | ICD-10-CM | POA: Insufficient documentation

## 2015-02-03 DIAGNOSIS — S060X1A Concussion with loss of consciousness of 30 minutes or less, initial encounter: Secondary | ICD-10-CM

## 2015-02-03 DIAGNOSIS — S0990XA Unspecified injury of head, initial encounter: Secondary | ICD-10-CM | POA: Diagnosis present
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
## 2015-02-03 NOTE — ED Notes (Signed)
Pt transported to CT ?

## 2015-02-03 NOTE — ED Notes (Signed)
Additionally reports nausea, but no vomiting.

## 2015-02-04 MED ORDER — OXYCODONE-ACETAMINOPHEN 5-325 MG PO TABS: 1.0000 | ORAL_TABLET | ORAL | Status: AC | PRN

## 2015-02-04 MED ORDER — OXYCODONE-ACETAMINOPHEN 5-325 MG PO TABS
1.0000 | ORAL_TABLET | Freq: Once | ORAL | Status: AC
  Administered 2015-02-04: 1 via ORAL
  Filled 2015-02-04: qty 1

## 2015-02-04 NOTE — Discharge Instructions (Signed)
Take acetaminophen or ibuprofen for less severe pain.   Concussion A concussion, or closed-head injury, is a brain injury caused by a direct blow to the head or by a quick and sudden movement (jolt) of the head or neck. Concussions are usually not life-threatening. Even so, the effects of a concussion can be serious. If you have had a concussion before, you are more likely to experience concussion-like symptoms after a direct blow to the head.  CAUSES  Direct blow to the head, such as from running into another player during a soccer game, being hit in a fight, or hitting your head on a hard surface.  A jolt of the head or neck that causes the brain to move back and forth inside the skull, such as in a car crash. SIGNS AND SYMPTOMS The signs of a concussion can be hard to notice. Early on, they may be missed by you, family members, and health care providers. You may look fine but act or feel differently. Symptoms are usually temporary, but they may last for days, weeks, or even longer. Some symptoms may appear right away while others may not show up for hours or days. Every head injury is different. Symptoms include:  Mild to moderate headaches that will not go away.  A feeling of pressure inside your head.  Having more trouble than usual:  Learning or remembering things you have heard.  Answering questions.  Paying attention or concentrating.  Organizing daily tasks.  Making decisions and solving problems.  Slowness in thinking, acting or reacting, speaking, or reading.  Getting lost or being easily confused.  Feeling tired all the time or lacking energy (fatigued).  Feeling drowsy.  Sleep disturbances.  Sleeping more than usual.  Sleeping less than usual.  Trouble falling asleep.  Trouble sleeping (insomnia).  Loss of balance or feeling lightheaded or dizzy.  Nausea or vomiting.  Numbness or tingling.  Increased sensitivity  to:  Sounds.  Lights.  Distractions.  Vision problems or eyes that tire easily.  Diminished sense of taste or smell.  Ringing in the ears.  Mood changes such as feeling sad or anxious.  Becoming easily irritated or angry for little or no reason.  Lack of motivation.  Seeing or hearing things other people do not see or hear (hallucinations). DIAGNOSIS Your health care provider can usually diagnose a concussion based on a description of your injury and symptoms. He or she will ask whether you passed out (lost consciousness) and whether you are having trouble remembering events that happened right before and during your injury. Your evaluation might include:  A brain scan to look for signs of injury to the brain. Even if the test shows no injury, you may still have a concussion.  Blood tests to be sure other problems are not present. TREATMENT  Concussions are usually treated in an emergency department, in urgent care, or at a clinic. You may need to stay in the hospital overnight for further treatment.  Tell your health care provider if you are taking any medicines, including prescription medicines, over-the-counter medicines, and natural remedies. Some medicines, such as blood thinners (anticoagulants) and aspirin, may increase the chance of complications. Also tell your health care provider whether you have had alcohol or are taking illegal drugs. This information may affect treatment.  Your health care provider will send you home with important instructions to follow.  How fast you will recover from a concussion depends on many factors. These factors include how severe your  concussion is, what part of your brain was injured, your age, and how healthy you were before the concussion.  Most people with mild injuries recover fully. Recovery can take time. In general, recovery is slower in older persons. Also, persons who have had a concussion in the past or have other medical  problems may find that it takes longer to recover from their current injury. HOME CARE INSTRUCTIONS General Instructions  Carefully follow the directions your health care provider gave you.  Only take over-the-counter or prescription medicines for pain, discomfort, or fever as directed by your health care provider.  Take only those medicines that your health care provider has approved.  Do not drink alcohol until your health care provider says you are well enough to do so. Alcohol and certain other drugs may slow your recovery and can put you at risk of further injury.  If it is harder than usual to remember things, write them down.  If you are easily distracted, try to do one thing at a time. For example, do not try to watch TV while fixing dinner.  Talk with family members or close friends when making important decisions.  Keep all follow-up appointments. Repeated evaluation of your symptoms is recommended for your recovery.  Watch your symptoms and tell others to do the same. Complications sometimes occur after a concussion. Older adults with a brain injury may have a higher risk of serious complications, such as a blood clot on the brain.  Tell your teachers, school nurse, school counselor, coach, athletic trainer, or work Production designer, theatre/television/film about your injury, symptoms, and restrictions. Tell them about what you can or cannot do. They should watch for:  Increased problems with attention or concentration.  Increased difficulty remembering or learning new information.  Increased time needed to complete tasks or assignments.  Increased irritability or decreased ability to cope with stress.  Increased symptoms.  Rest. Rest helps the brain to heal. Make sure you:  Get plenty of sleep at night. Avoid staying up late at night.  Keep the same bedtime hours on weekends and weekdays.  Rest during the day. Take daytime naps or rest breaks when you feel tired.  Limit activities that require a  lot of thought or concentration. These include:  Doing homework or job-related work.  Watching TV.  Working on the computer.  Avoid any situation where there is potential for another head injury (football, hockey, soccer, basketball, martial arts, downhill snow sports and horseback riding). Your condition will get worse every time you experience a concussion. You should avoid these activities until you are evaluated by the appropriate follow-up health care providers. Returning To Your Regular Activities You will need to return to your normal activities slowly, not all at once. You must give your body and brain enough time for recovery.  Do not return to sports or other athletic activities until your health care provider tells you it is safe to do so.  Ask your health care provider when you can drive, ride a bicycle, or operate heavy machinery. Your ability to react may be slower after a brain injury. Never do these activities if you are dizzy.  Ask your health care provider about when you can return to work or school. Preventing Another Concussion It is very important to avoid another brain injury, especially before you have recovered. In rare cases, another injury can lead to permanent brain damage, brain swelling, or death. The risk of this is greatest during the first 7-10 days after  a head injury. Avoid injuries by:  Wearing a seat belt when riding in a car.  Drinking alcohol only in moderation.  Wearing a helmet when biking, skiing, skateboarding, skating, or doing similar activities.  Avoiding activities that could lead to a second concussion, such as contact or recreational sports, until your health care provider says it is okay.  Taking safety measures in your home.  Remove clutter and tripping hazards from floors and stairways.  Use grab bars in bathrooms and handrails by stairs.  Place non-slip mats on floors and in bathtubs.  Improve lighting in dim areas. SEEK MEDICAL  CARE IF:  You have increased problems paying attention or concentrating.  You have increased difficulty remembering or learning new information.  You need more time to complete tasks or assignments than before.  You have increased irritability or decreased ability to cope with stress.  You have more symptoms than before. Seek medical care if you have any of the following symptoms for more than 2 weeks after your injury:  Lasting (chronic) headaches.  Dizziness or balance problems.  Nausea.  Vision problems.  Increased sensitivity to noise or light.  Depression or mood swings.  Anxiety or irritability.  Memory problems.  Difficulty concentrating or paying attention.  Sleep problems.  Feeling tired all the time. SEEK IMMEDIATE MEDICAL CARE IF:  You have severe or worsening headaches. These may be a sign of a blood clot in the brain.  You have weakness (even if only in one hand, leg, or part of the face).  You have numbness.  You have decreased coordination.  You vomit repeatedly.  You have increased sleepiness.  One pupil is larger than the other.  You have convulsions.  You have slurred speech.  You have increased confusion. This may be a sign of a blood clot in the brain.  You have increased restlessness, agitation, or irritability.  You are unable to recognize people or places.  You have neck pain.  It is difficult to wake you up.  You have unusual behavior changes.  You lose consciousness. MAKE SURE YOU:  Understand these instructions.  Will watch your condition.  Will get help right away if you are not doing well or get worse. Document Released: 01/26/2004 Document Revised: 11/10/2013 Document Reviewed: 05/28/2013 [facility] Patient Information 2015 [place], [place]. This information is not intended to replace advice given to you by your health care provider. Make sure you discuss any questions you have with your health care  provider.  Acetaminophen; Oxycodone tablets What is this medicine? ACETAMINOPHEN; OXYCODONE (a set a MEE noe fen; ox i KOE done) is a pain reliever. It is used to treat mild to moderate pain. This medicine may be used for other purposes; ask your health care provider or pharmacist if you have questions. COMMON BRAND NAME(S): Endocet, Magnacet, Narvox, Percocet, Perloxx, Primalev, Primlev, Roxicet, Xolox What should I tell my health care provider before I take this medicine? They need to know if you have any of these conditions: -brain tumor -Crohn's disease, inflammatory bowel disease, or ulcerative colitis -drug abuse or addiction -head injury -heart or circulation problems -if you often drink alcohol -kidney disease or problems going to the bathroom -liver disease -lung disease, asthma, or breathing problems -an unusual or allergic reaction to acetaminophen, oxycodone, other opioid analgesics, other medicines, foods, dyes, or preservatives -pregnant or trying to get pregnant -breast-feeding How should I use this medicine? Take this medicine by mouth with a full glass of water. Follow the  directions on the prescription label. Take your medicine at regular intervals. Do not take your medicine more often than directed. Talk to your pediatrician regarding the use of this medicine in children. Special care may be needed. Patients over 25 years old may have a stronger reaction and need a smaller dose. Overdosage: If you think you have taken too much of this medicine contact a poison control center or emergency room at once. NOTE: This medicine is only for you. Do not share this medicine with others. What if I miss a dose? If you miss a dose, take it as soon as you can. If it is almost time for your next dose, take only that dose. Do not take double or extra doses. What may interact with this medicine? -alcohol -antihistamines -barbiturates like amobarbital, butalbital, butabarbital,  methohexital, pentobarbital, phenobarbital, thiopental, and secobarbital -benztropine -drugs for bladder problems like solifenacin, trospium, oxybutynin, tolterodine, hyoscyamine, and methscopolamine -drugs for breathing problems like ipratropium and tiotropium -drugs for certain stomach or intestine problems like propantheline, homatropine methylbromide, glycopyrrolate, atropine, belladonna, and dicyclomine -general anesthetics like etomidate, ketamine, nitrous oxide, propofol, desflurane, enflurane, halothane, isoflurane, and sevoflurane -medicines for depression, anxiety, or psychotic disturbances -medicines for sleep -muscle relaxants -naltrexone -narcotic medicines (opiates) for pain -phenothiazines like perphenazine, thioridazine, chlorpromazine, mesoridazine, fluphenazine, prochlorperazine, promazine, and trifluoperazine -scopolamine -tramadol -trihexyphenidyl This list may not describe all possible interactions. Give your health care provider a list of all the medicines, herbs, non-prescription drugs, or dietary supplements you use. Also tell them if you smoke, drink alcohol, or use illegal drugs. Some items may interact with your medicine. What should I watch for while using this medicine? Tell your doctor or health care professional if your pain does not go away, if it gets worse, or if you have new or a different type of pain. You may develop tolerance to the medicine. Tolerance means that you will need a higher dose of the medication for pain relief. Tolerance is normal and is expected if you take this medicine for a long time. Do not suddenly stop taking your medicine because you may develop a severe reaction. Your body becomes used to the medicine. This does NOT mean you are addicted. Addiction is a behavior related to getting and using a drug for a non-medical reason. If you have pain, you have a medical reason to take pain medicine. Your doctor will tell you how much medicine to  take. If your doctor wants you to stop the medicine, the dose will be slowly lowered over time to avoid any side effects. You may get drowsy or dizzy. Do not drive, use machinery, or do anything that needs mental alertness until you know how this medicine affects you. Do not stand or sit up quickly, especially if you are an older patient. This reduces the risk of dizzy or fainting spells. Alcohol may interfere with the effect of this medicine. Avoid alcoholic drinks. There are different types of narcotic medicines (opiates) for pain. If you take more than one type at the same time, you may have more side effects. Give your health care provider a list of all medicines you use. Your doctor will tell you how much medicine to take. Do not take more medicine than directed. Call emergency for help if you have problems breathing. The medicine will cause constipation. Try to have a bowel movement at least every 2 to 3 days. If you do not have a bowel movement for 3 days, call your doctor or health care  professional. Do not take Tylenol (acetaminophen) or medicines that have acetaminophen with this medicine. Too much acetaminophen can be very dangerous. Many nonprescription medicines contain acetaminophen. Always read the labels carefully to avoid taking more acetaminophen. What side effects may I notice from receiving this medicine? Side effects that you should report to your doctor or health care professional as soon as possible: -allergic reactions like skin rash, itching or hives, swelling of the face, lips, or tongue -breathing difficulties, wheezing -confusion -light headedness or fainting spells -severe stomach pain -unusually weak or tired -yellowing of the skin or the whites of the eyes Side effects that usually do not require medical attention (report to your doctor or health care professional if they continue or are bothersome): -dizziness -drowsiness -nausea -vomiting This list may not describe  all possible side effects. Call your doctor for medical advice about side effects. You may report side effects to FDA at 1-800-FDA-1088. Where should I keep my medicine? Keep out of the reach of children. This medicine can be abused. Keep your medicine in a safe place to protect it from theft. Do not share this medicine with anyone. Selling or giving away this medicine is dangerous and against the law. Store at room temperature between 20 and 25 degrees C (68 and 77 degrees F). Keep container tightly closed. Protect from light. This medicine may cause accidental overdose and death if it is taken by other adults, children, or pets. Flush any unused medicine down the toilet to reduce the chance of harm. Do not use the medicine after the expiration date. NOTE: This sheet is a summary. It may not cover all possible information. If you have questions about this medicine, talk to your doctor, pharmacist, or health care provider.  2015, [employer]. (2013-06-29 13:17:35)

## 2015-02-04 NOTE — ED Notes (Signed)
Dr. Glick at bedside.  

## 2015-04-10 ENCOUNTER — Emergency Department (HOSPITAL_COMMUNITY)
Admission: EM | Admit: 2015-04-10 | Discharge: 2015-04-10 | Disposition: A | Discharge status: Home / Self Care | Attending: Emergency Medicine | Admitting: Emergency Medicine

## 2015-04-10 ENCOUNTER — Encounter (HOSPITAL_COMMUNITY): Payer: Self-pay | Admitting: Emergency Medicine

## 2015-04-10 DIAGNOSIS — R51 Headache: Secondary | ICD-10-CM | POA: Diagnosis not present

## 2015-04-10 DIAGNOSIS — R220 Localized swelling, mass and lump, head: Secondary | ICD-10-CM | POA: Diagnosis present

## 2015-04-10 DIAGNOSIS — Z79899 Other long term (current) drug therapy: Secondary | ICD-10-CM | POA: Diagnosis not present

## 2015-04-10 DIAGNOSIS — Z792 Long term (current) use of antibiotics: Secondary | ICD-10-CM | POA: Insufficient documentation

## 2015-04-10 DIAGNOSIS — G8929 Other chronic pain: Secondary | ICD-10-CM | POA: Insufficient documentation

## 2015-04-10 DIAGNOSIS — L03211 Cellulitis of face: Secondary | ICD-10-CM | POA: Insufficient documentation

## 2015-04-10 DIAGNOSIS — F419 Anxiety disorder, unspecified: Secondary | ICD-10-CM | POA: Diagnosis not present

## 2015-04-10 MED ORDER — HYDROCODONE-ACETAMINOPHEN 5-325 MG PO TABS
1.0000 | ORAL_TABLET | Freq: Once | ORAL | Status: AC
  Administered 2015-04-10: 1 via ORAL
  Filled 2015-04-10: qty 1

## 2015-04-10 MED ORDER — HYDROCODONE-ACETAMINOPHEN 5-325 MG PO TABS: 1.0000 | ORAL_TABLET | Freq: Four times a day (QID) | ORAL | Status: AC | PRN

## 2015-04-10 MED ORDER — CEPHALEXIN 500 MG PO CAPS: ORAL_CAPSULE | ORAL | Status: DC

## 2015-04-10 MED ORDER — SULFAMETHOXAZOLE-TRIMETHOPRIM 800-160 MG PO TABS: 1.0000 | ORAL_TABLET | Freq: Two times a day (BID) | ORAL | Status: AC

## 2015-04-10 MED ORDER — NAPROXEN 500 MG PO TABS: 500.0000 mg | ORAL_TABLET | Freq: Two times a day (BID) | ORAL | Status: AC | PRN

## 2015-04-10 NOTE — ED Notes (Signed)
Pt c/o pain and swelling to nose x 2 days. Area red in color, reports pain began on the inside of the right nostril. No drainage noted. Pt has been taking ibuprofen.

## 2015-04-10 NOTE — Discharge Instructions (Signed)
Keep area clean and dry. Apply warm compresses to affected area throughout the day. Take antibiotics until it is finished. Take naprosyn and norco as directed, as needed for pain but do not drive or operate machinery with pain medication use. Followup with [facility] doctor in 2 days for wound recheck. Monitor area for signs of infection to include, but not limited to: increasing pain, redness, drainage/pus, or swelling. Return to emergency department for emergent changing or worsening symptoms.   Cellulitis Cellulitis is an infection of the skin and the tissue under the skin. The infected area is usually red and tender. This happens most often in the arms and lower legs. HOME CARE   Take your antibiotic medicine as told. Finish the medicine even if you start to feel better.  Keep the infected arm or leg raised (elevated).  Put a warm cloth on the area up to 4 times per day.  Only take medicines as told by your doctor.  Keep all doctor visits as told. GET HELP IF:  You see red streaks on the skin coming from the infected area.  Your red area gets bigger or turns a dark color.  Your bone or joint under the infected area is painful after the skin heals.  Your infection comes back in the same area or different area.  You have a puffy (swollen) bump in the infected area.  You have new symptoms.  You have a fever. GET HELP RIGHT AWAY IF:   You feel very sleepy.  You throw up (vomit) or have watery poop (diarrhea).  You feel sick and have muscle aches and pains. MAKE SURE YOU:   Understand these instructions.  Will watch your condition.  Will get help right away if you are not doing well or get worse. Document Released: 04/23/2008 Document Revised: 03/22/2014 Document Reviewed: 01/21/2012 [facility] Patient Information 2015 [place], [place]. This information is not intended to replace advice given to you by your health care provider. Make sure you discuss  any questions you have with your health care provider.  Facial Infection You have an infection of your face. This requires special attention to help prevent serious problems. Infections in facial wounds can cause poor healing and scars. They can also spread to deeper tissues, especially around the eye. Wound and dental infections can lead to sinusitis, infection of the eye socket, and even meningitis. Permanent damage to the skin, eye, and nervous system may result if facial infections are not treated properly. With severe infections, hospital care for IV antibiotic injections may be needed if they don't respond to oral antibiotics. Antibiotics must be taken for the full course to insure the infection is eliminated. If the infection came from a bad tooth, it may have to be extracted when the infection is under control. Warm compresses may be applied to reduce skin irritation and remove drainage. You might need a tetanus shot now if:  You cannot remember when your last tetanus shot was.  You have never had a tetanus shot.  The object that caused your wound was dirty. If you need a tetanus shot, and you decide not to get one, there is a rare chance of getting tetanus. Sickness from tetanus can be serious. If you got a tetanus shot, your arm may swell, get red and warm to the touch at the shot site. This is common and not a problem. SEEK IMMEDIATE MEDICAL CARE IF:   You have increased swelling, redness, or trouble breathing.  You have a severe headache, dizziness, nausea, or vomiting.  You develop problems with your eyesight.  You have a fever. Document Released: 12/13/2004 Document Revised: 01/28/2012 Document Reviewed: 11/05/2005 [facility] Patient Information 2015 [place]. This information is not intended to replace advice given to you by your health care provider. Make sure you discuss any questions you have with your health care provider.

## 2015-04-10 NOTE — ED Provider Notes (Signed)
CSN: [id number]     Arrival date & time 04/10/15  1534 History  This chart was scribed for non-physician practitioner, Allen Derry, PA-C, working with Glynn Octave, MD, by Modena Jansky, ED Scribe. This patient was seen in room TR07C/TR07C and the patient's care was started at 4:11 PM.  Chief Complaint  Patient presents with  . Facial Swelling   Patient is a 24 y.o. male presenting with general illness. The history is provided by the patient. No language interpreter was used.  Illness Location:  Right nare Quality:  Throbbing  Severity:  Moderate Onset quality:  Gradual Duration:  2 days Timing:  Constant Progression:  Worsening Chronicity:  New Context:  None Relieved by:  None Worsened by:  Palpation Ineffective treatments:  Ibuprofen and tylenol  Associated symptoms: headaches (referred pain from nose)   Associated symptoms: no abdominal pain, no chest pain, no cough, no ear pain, no fever, no myalgias, no nausea, no rhinorrhea, no shortness of breath and no vomiting   HPI Comments: Charles Barber is a 24 y.o. healthy male who presents to the Emergency Department complaining of constant moderate nasal pain that started yesterday. He reports that he has been having nasal pain with the inside of his right nare with associated redness and swelling since yesterday. He denies any known bumps in his nare or on the outside of his nose. He currently rates the severity of the pain as a 8/10, and describes the pain as a throbbing sensation, which radiates to the right side of his head and eye. He reports that palpation exacerbates the pain. He states that he has been using ibuprofen and tylenol with minimal relief. He denies any red streaks, fever, ear drainage, eye drainage, sore throat, chest pain, SOB, cough, abd pain, nausea, vomiting, numbness, tingling, weakness, vision changes, or dizziness. Healthy otherwise. No allergies.  Past Medical History  Diagnosis Date  . PTSD  (post-traumatic stress disorder)   . Chronic leg pain   . ADD (attention deficit disorder)   . Anxiety    History reviewed. No pertinent past surgical history. No family history on file. History  Substance Use Topics  . Smoking status: Never Smoker   . Smokeless tobacco: Not on file  . Alcohol Use: Yes    Review of Systems  Constitutional: Negative for fever and chills.  HENT: Positive for facial swelling (nose). Negative for ear discharge, ear pain, rhinorrhea and sinus pressure.   Eyes: Negative for pain, redness and visual disturbance.  Respiratory: Negative for cough and shortness of breath.   Cardiovascular: Negative for chest pain.  Gastrointestinal: Negative for nausea, vomiting and abdominal pain.  Musculoskeletal: Negative for myalgias and arthralgias.  Skin: Positive for color change (erythema to nose).  Allergic/Immunologic: Negative for immunocompromised state.  Neurological: Positive for headaches (referred pain from nose). Negative for dizziness, weakness, light-headedness and numbness.  Psychiatric/Behavioral: Negative for confusion.   10 Systems reviewed and all are negative for acute change except as noted in the HPI.  Allergies  Review of patient's allergies indicates no known allergies.  Home Medications   Prior to Admission medications   Medication Sig Start Date End Date Taking? Authorizing Provider  acetaminophen (TYLENOL) 500 MG tablet Take 1,000 mg by mouth every 6 (six) hours as needed.    Historical Provider, MD  ALPRAZolam Prudy Feeler) 1 MG tablet Take 1 mg by mouth 4 (four) times daily.    Historical Provider, MD  Calcium-Magnesium 333-167 MG TABS Take 1 tablet by mouth daily.  Historical Provider, MD  cephALEXin (KEFLEX) 500 MG capsule Take 1 capsule (500 mg total) by mouth 3 (three) times daily. 01/15/15   Tatyana Kirichenko, PA-C  cholecalciferol (VITAMIN D) 1000 UNITS tablet Take 1,000 Units by mouth daily.    Historical Provider, MD  clonazePAM  (KLONOPIN) 0.5 MG tablet Take 2 tablets (1 mg total) by mouth 2 (two) times daily as needed for anxiety. 01/15/14   Ruby Cola, PA-C  DULoxetine (CYMBALTA) 60 MG capsule Take 60 mg by mouth daily.    Historical Provider, MD  Eszopiclone (ESZOPICLONE) 3 MG TABS Take 2 tablets (6 mg total) by mouth at bedtime. Take immediately before bedtime 01/15/14   Ruby Cola, PA-C  Hawthorne Berry 550 MG CAPS Take 1 capsule by mouth daily.    Historical Provider, MD  ibuprofen (ADVIL,MOTRIN) 600 MG tablet Take 1 tablet (600 mg total) by mouth every 6 (six) hours as needed. 01/15/15   Tatyana Kirichenko, PA-C  Methylfol-Methylcob-Acetylcyst (CEREFOLIN NAC PO) Take 1 tablet by mouth daily.    Historical Provider, MD  methylphenidate (RITALIN) 10 MG tablet Take 1 tablet (10 mg total) by mouth 4 (four) times daily. 01/15/14   Ruby Cola, PA-C  Multiple Vitamin (MULTIVITAMIN WITH MINERALS) TABS tablet Take 1 tablet by mouth daily.    Historical Provider, MD  oxyCODONE-acetaminophen (PERCOCET) 5-325 MG per tablet Take 1 tablet by mouth every 4 (four) hours as needed for moderate pain. 02/04/15   Dione Booze, MD  temazepam (RESTORIL) 30 MG capsule Take 1 capsule (30 mg total) by mouth at bedtime. 01/15/14   Catherine Schinlever, PA-C   BP 149/71 mmHg  Pulse 62  Temp(Src) 98.3 F (36.8 C)  Resp 18  Ht  (1.753 m)  Wt 176 lb (79.833 kg)  BMI 25.98 kg/m2  SpO2 97% Physical Exam  Constitutional: He is oriented to person, place, and time. Vital signs are normal. He appears well-developed and well-nourished.  Non-toxic appearance. No distress.  Afebrile, nontoxic, NAD  HENT:  Head: Normocephalic and atraumatic. Head is without right periorbital erythema and without left periorbital erythema.  Nose: Sinus tenderness present. No mucosal edema or nose lacerations.    Mouth/Throat: Uvula is midline, oropharynx is clear and moist and mucous membranes are normal. No trismus in the jaw. No  uvula swelling.  Erythema and warmth to the distal tip of the nose, with mild swelling and TTP, no weeping or drainage, no abscess. Nasal mucosa clear without rhinorrhea or obvious skin injury.   Eyes: Conjunctivae and EOM are normal. Pupils are equal, round, and reactive to light. Right eye exhibits no discharge. Left eye exhibits no discharge.  PERRL, EOMI, no nystagmus, no visual field deficits   Neck: Normal range of motion. Neck supple.  Cardiovascular: Normal rate, regular rhythm, normal heart sounds and intact distal pulses.  Exam reveals no gallop and no friction rub.   No murmur heard. Pulmonary/Chest: Effort normal. No respiratory distress.  Abdominal: Normal appearance. He exhibits no distension.  Musculoskeletal: Normal range of motion.  Neurological: He is alert and oriented to person, place, and time. He has normal strength. No cranial nerve deficit or sensory deficit. Coordination and gait normal. GCS eye subscore is 4. GCS verbal subscore is 5. GCS motor subscore is 6.  CN 2-12 grossly intact A&O x4 GCS 15 Sensation and strength intact Gait nonataxic including with tandem walking Coordination with finger-to-nose WNL  Skin: Skin is warm, dry and intact. No rash noted. There is erythema.  Nasal erythema as noted  above  Psychiatric: He has a normal mood and affect.  Nursing note and vitals reviewed.   ED Course  Procedures (including critical care time) DIAGNOSTIC STUDIES: Oxygen Saturation is 97% on RA, normal by my interpretation.    COORDINATION OF CARE: 4:15 PM- Pt advised of plan for treatment which includes medication and pt agrees.  Labs Review Labs Reviewed - No data to display  Imaging Review No results found.   EKG Interpretation None      MDM   Final diagnoses:  Facial cellulitis    24 y.o. male here with nose swelling to the tip of his nose, mild warmth and erythema, no obvious skin injury but appears to be cellulitis. No abscess. Has headache  from referred pain, but no red flag signs or symptoms, doubt need for head CT imaging. Otherwise healthy. Will treat with keflex/bactrim and give pain meds and have him return in 2 days for recheck given the location of the face. Doubt sinus thrombus or other concerning neurologic complication, no periorbital erythema or edema, EOMI, and afebrile. I explained the diagnosis and have given explicit precautions to return to the ER including for any other new or worsening symptoms. The patient understands and accepts the medical plan as it's been dictated and I have answered their questions. Discharge instructions concerning home care and prescriptions have been given. The patient is STABLE and is discharged to home in good condition.  BP 141/67 mmHg  Pulse 59  Temp(Src) 98.3 F (36.8 C)  Resp 18  Ht 5\' 9"  (1.753 m)  Wt 176 lb (79.833 kg)  BMI 25.98 kg/m2  SpO2 100%  Meds ordered this encounter  Medications  . HYDROcodone-acetaminophen (NORCO/VICODIN) 5-325 MG per tablet 1 tablet    Sig:   . sulfamethoxazole-trimethoprim (BACTRIM DS,SEPTRA DS) 800-160 MG per tablet    Sig: Take 1 tablet by mouth 2 (two) times daily.    Dispense:  14 tablet    Refill:  0    Order Specific Question:  Supervising Provider    Answer:  MILLER, BRIAN [[id number]]  . cephALEXin (KEFLEX) 500 MG capsule    Sig: 2 caps po bid x 7 days    Dispense:  28 capsule    Refill:  0    Order Specific Question:  Supervising Provider    Answer:  MILLER, BRIAN [[id number]]  . HYDROcodone-acetaminophen (NORCO) 5-325 MG per tablet    Sig: Take 1-2 tablets by mouth every 6 (six) hours as needed for severe pain.    Dispense:  6 tablet    Refill:  0    Order Specific Question:  Supervising Provider    Answer:  MILLER, BRIAN [[id number]]  . naproxen (NAPROSYN) 500 MG tablet    Sig: Take 1 tablet (500 mg total) by mouth 2 (two) times daily as needed for mild pain, moderate pain or headache (TAKE WITH MEALS.).    Dispense:  20 tablet    Refill:  0     Order Specific Question:  Supervising Provider    Answer:  Eber HongMILLER, BRIAN [[id number]]     Xane Amsden Camprubi-Soms, PA-C 04/10/15 1720  Glynn OctaveStephen Rancour, MD 04/11/15 [id number]

## 2015-06-03 ENCOUNTER — Emergency Department (HOSPITAL_COMMUNITY)
Admission: EM | Admit: 2015-06-03 | Discharge: 2015-06-03 | Disposition: A | Discharge status: Home / Self Care | Attending: Emergency Medicine | Admitting: Emergency Medicine

## 2015-06-03 ENCOUNTER — Encounter (HOSPITAL_COMMUNITY): Payer: Self-pay | Admitting: Emergency Medicine

## 2015-06-03 DIAGNOSIS — G8929 Other chronic pain: Secondary | ICD-10-CM | POA: Insufficient documentation

## 2015-06-03 DIAGNOSIS — R51 Headache: Secondary | ICD-10-CM | POA: Insufficient documentation

## 2015-06-03 DIAGNOSIS — H53149 Visual discomfort, unspecified: Secondary | ICD-10-CM | POA: Insufficient documentation

## 2015-06-03 DIAGNOSIS — F419 Anxiety disorder, unspecified: Secondary | ICD-10-CM | POA: Insufficient documentation

## 2015-06-03 DIAGNOSIS — R519 Headache, unspecified: Secondary | ICD-10-CM

## 2015-06-03 MED ORDER — KETOROLAC TROMETHAMINE 30 MG/ML IJ SOLN
15.0000 mg | Freq: Once | INTRAMUSCULAR | Status: AC
  Administered 2015-06-03: 15 mg via INTRAVENOUS
  Filled 2015-06-03: qty 1

## 2015-06-03 MED ORDER — SODIUM CHLORIDE 0.9 % IV BOLUS (SEPSIS)
1000.0000 mL | Freq: Once | INTRAVENOUS | Status: AC
  Administered 2015-06-03: 1000 mL via INTRAVENOUS

## 2015-06-03 MED ORDER — DEXAMETHASONE SODIUM PHOSPHATE 10 MG/ML IJ SOLN
10.0000 mg | Freq: Once | INTRAMUSCULAR | Status: AC
  Administered 2015-06-03: 10 mg via INTRAVENOUS
  Filled 2015-06-03: qty 1

## 2015-06-03 MED ORDER — METOCLOPRAMIDE HCL 5 MG/ML IJ SOLN
10.0000 mg | Freq: Once | INTRAMUSCULAR | Status: AC
  Administered 2015-06-03: 10 mg via INTRAVENOUS
  Filled 2015-06-03: qty 2

## 2015-06-03 MED ORDER — DIPHENHYDRAMINE HCL 50 MG/ML IJ SOLN
25.0000 mg | Freq: Once | INTRAMUSCULAR | Status: AC
  Administered 2015-06-03: 25 mg via INTRAVENOUS
  Filled 2015-06-03: qty 1

## 2015-06-03 NOTE — ED Provider Notes (Signed)
CSN: [id number]     Arrival date & time 06/03/15  [id number] History   First MD Initiated Contact with Patient 06/03/15 0701     Chief Complaint  Patient presents with  . Migraine     Patient is a 24 y.o. male presenting with migraines. The history is provided by the patient. No language interpreter was used.  Migraine   Mr. Charles Barber presents for evaluation of headache. He states that he's had a migraine since last night. The headache is located on the left side of his face and around his time. It is constant in nature and severe. The pain wraps around to his neck. He has associated photophobia and generalized weakness. He states that he has a history of recurrent headaches and migraines for the last 6 months and is taking Dilaudid and Midrin for these headaches. He endorses poor sleep lately. Symptoms are severe, constant, worsening.  Past Medical History  Diagnosis Date  . PTSD (post-traumatic stress disorder)   . Chronic leg pain   . ADD (attention deficit disorder)   . Anxiety    History reviewed. No pertinent past surgical history. No family history on file. History  Substance Use Topics  . Smoking status: Never Smoker   . Smokeless tobacco: Not on file  . Alcohol Use: Yes    Review of Systems  All other systems reviewed and are negative.     Allergies  Review of patient's allergies indicates no known allergies.  Home Medications   Prior to Admission medications   Medication Sig Start Date End Date Taking? Authorizing Provider  acetaminophen (TYLENOL) 500 MG tablet Take 1,000 mg by mouth every 6 (six) hours as needed.    Historical Provider, MD  ALPRAZolam Prudy Feeler(XANAX) 1 MG tablet Take 1 mg by mouth 4 (four) times daily.    Historical Provider, MD  Calcium-Magnesium 333-167 MG TABS Take 1 tablet by mouth daily.    Historical Provider, MD  cephALEXin (KEFLEX) 500 MG capsule Take 1 capsule (500 mg total) by mouth 3 (three) times daily. 01/15/15   Tatyana Kirichenko, PA-C   cephALEXin (KEFLEX) 500 MG capsule 2 caps po bid x 7 days 04/10/15   Mercedes Camprubi-Soms, PA-C  cholecalciferol (VITAMIN D) 1000 UNITS tablet Take 1,000 Units by mouth daily.    Historical Provider, MD  clonazePAM (KLONOPIN) 0.5 MG tablet Take 2 tablets (1 mg total) by mouth 2 (two) times daily as needed for anxiety. 01/15/14   Ruby Colaatherine Schinlever, PA-C  DULoxetine (CYMBALTA) 60 MG capsule Take 60 mg by mouth daily.    Historical Provider, MD  Eszopiclone (ESZOPICLONE) 3 MG TABS Take 2 tablets (6 mg total) by mouth at bedtime. Take immediately before bedtime 01/15/14   Ruby Colaatherine Schinlever, PA-C  Hawthorne Berry 550 MG CAPS Take 1 capsule by mouth daily.    Historical Provider, MD  HYDROcodone-acetaminophen (NORCO) 5-325 MG per tablet Take 1-2 tablets by mouth every 6 (six) hours as needed for severe pain. 04/10/15   Mercedes Camprubi-Soms, PA-C  ibuprofen (ADVIL,MOTRIN) 600 MG tablet Take 1 tablet (600 mg total) by mouth every 6 (six) hours as needed. 01/15/15   Tatyana Kirichenko, PA-C  Methylfol-Methylcob-Acetylcyst (CEREFOLIN NAC PO) Take 1 tablet by mouth daily.    Historical Provider, MD  methylphenidate (RITALIN) 10 MG tablet Take 1 tablet (10 mg total) by mouth 4 (four) times daily. 01/15/14   Ruby Colaatherine Schinlever, PA-C  Multiple Vitamin (MULTIVITAMIN WITH MINERALS) TABS tablet Take 1 tablet by mouth daily.    Historical Provider, MD  naproxen (NAPROSYN) 500 MG tablet Take 1 tablet (500 mg total) by mouth 2 (two) times daily as needed for mild pain, moderate pain or headache (TAKE WITH MEALS.). 04/10/15   Mercedes Camprubi-Soms, PA-C  oxyCODONE-acetaminophen (PERCOCET) 5-325 MG per tablet Take 1 tablet by mouth every 4 (four) hours as needed for moderate pain. 02/04/15   Dione Booze, MD  sulfamethoxazole-trimethoprim (BACTRIM DS,SEPTRA DS) 800-160 MG per tablet Take 1 tablet by mouth 2 (two) times daily. 04/10/15   Mercedes Camprubi-Soms, PA-C  temazepam (RESTORIL) 30 MG capsule Take 1 capsule (30  mg total) by mouth at bedtime. 01/15/14   Catherine Schinlever, PA-C   BP 125/63 mmHg  Pulse 86  Temp(Src) 98.7 F (37.1 C)  Resp 22  SpO2 98% Physical Exam  Constitutional: He is oriented to person, place, and time. He appears well-developed and well-nourished.  Uncomfortable appearing  HENT:  Head: Normocephalic and atraumatic.  Mouth/Throat: Oropharynx is clear and moist.  Eyes: EOM are normal. Pupils are equal, round, and reactive to light.  Photophobia  Cardiovascular: Normal rate and regular rhythm.   No murmur heard. Pulmonary/Chest: Effort normal and breath sounds normal. No respiratory distress.  Abdominal: Soft. There is no tenderness. There is no rebound and no guarding.  Musculoskeletal: He exhibits no edema or tenderness.  Neurological: He is alert and oriented to person, place, and time. No cranial nerve deficit.  5 out of 5 strength in all 4 extremities  Skin: Skin is warm and dry.  Psychiatric: He has a normal mood and affect. His behavior is normal.  Nursing note and vitals reviewed.   ED Course  Procedures (including critical care time) Labs Review Labs Reviewed - No data to display  Imaging Review No results found.   EKG Interpretation None      MDM   Final diagnoses:  Bad headache    Patient with history of migraine headache here with recurrent headache. He had imaging of his brain in March 2016. History and presentation is not consistent with space-occupying lesion, meningitis, subarachnoid hemorrhage. Headache resolved after headache cocktail.   Tilden Fossa, MD 06/03/15 [phone]

## 2015-06-03 NOTE — ED Notes (Signed)
Bed: [id number] Expected date:  Expected time:  Means of arrival:  Comments: EMS head pain for 7 months

## 2015-06-03 NOTE — ED Notes (Signed)
Graham crackers and sprite given

## 2015-06-03 NOTE — ED Notes (Signed)
While talking to pt he states that the last time he "really slept" was over 2 nights ago.

## 2015-06-03 NOTE — ED Notes (Addendum)
Pt from home states he has had a migraine since 2000, but has suffered on and off for the past 7 months. Pt states he has blurred vision, sensitivity to light, insomnia, nausea and bilateral arm weakness. Pt has a history of migraines. EMS reports that pt fell while getting into the ambulance, which the pt reports is due to his blurred vision. Pt states he took 2 hydrocodone this morning, but it did not improve his pain. Pt is a veteran, and he [facility] linking [facility]ra[facility] to his PTSD.

## 2015-06-03 NOTE — Discharge Instructions (Signed)

## 2015-06-14 ENCOUNTER — Encounter (HOSPITAL_COMMUNITY): Payer: Self-pay | Admitting: Emergency Medicine

## 2015-06-14 ENCOUNTER — Emergency Department (HOSPITAL_COMMUNITY)
Admission: EM | Admit: 2015-06-14 | Discharge: 2015-06-14 | Discharge status: Against Medical Advice | Attending: Emergency Medicine | Admitting: Emergency Medicine

## 2015-06-14 DIAGNOSIS — G8929 Other chronic pain: Secondary | ICD-10-CM | POA: Insufficient documentation

## 2015-06-14 DIAGNOSIS — Z008 Encounter for other general examination: Secondary | ICD-10-CM | POA: Insufficient documentation

## 2015-06-14 HISTORY — DX: Insomnia, unspecified: G47.00

## 2015-06-14 NOTE — ED Notes (Signed)
Informed pt of need to go to [facility] screening appointment tomorrow am to get accepted [facility]. Spoke with PA about prescription for Lunesta, but will not be able to fill rx in the ED. Pt decided to leave ED. Pt ambulatory and in no distress.

## 2015-06-14 NOTE — ED Notes (Signed)
Pt reports he has been accepted at [facility] for opiate detox. Was sent here for medical clearance and possibly to fill rx for lunesta. No SI/HI. Does not drink etoh.

## 2015-06-14 NOTE — ED Notes (Signed)
Called [facility]. [facility] reports that pt has not been accepted yet, has screening appointment Wednesday at 8am.

## 2015-06-16 ENCOUNTER — Emergency Department (HOSPITAL_COMMUNITY)
Admission: EM | Admit: 2015-06-16 | Discharge: 2015-06-17 | Disposition: A | Discharge status: Other Acute Inpatient Hospital | Attending: Emergency Medicine | Admitting: Emergency Medicine

## 2015-06-16 ENCOUNTER — Encounter (HOSPITAL_COMMUNITY): Payer: Self-pay | Admitting: Emergency Medicine

## 2015-06-16 DIAGNOSIS — F4310 Post-traumatic stress disorder, unspecified: Secondary | ICD-10-CM | POA: Insufficient documentation

## 2015-06-16 DIAGNOSIS — G8929 Other chronic pain: Secondary | ICD-10-CM | POA: Insufficient documentation

## 2015-06-16 DIAGNOSIS — R45851 Suicidal ideations: Secondary | ICD-10-CM | POA: Diagnosis present

## 2015-06-16 DIAGNOSIS — G4700 Insomnia, unspecified: Secondary | ICD-10-CM | POA: Diagnosis not present

## 2015-06-16 DIAGNOSIS — F419 Anxiety disorder, unspecified: Secondary | ICD-10-CM | POA: Diagnosis not present

## 2015-06-16 DIAGNOSIS — F329 Major depressive disorder, single episode, unspecified: Secondary | ICD-10-CM | POA: Diagnosis not present

## 2015-06-16 DIAGNOSIS — F1410 Cocaine abuse, uncomplicated: Secondary | ICD-10-CM | POA: Diagnosis not present

## 2015-06-16 DIAGNOSIS — F909 Attention-deficit hyperactivity disorder, unspecified type: Secondary | ICD-10-CM | POA: Insufficient documentation

## 2015-06-16 DIAGNOSIS — Z79899 Other long term (current) drug therapy: Secondary | ICD-10-CM | POA: Diagnosis not present

## 2015-06-16 HISTORY — DX: Depression, unspecified: F32.A

## 2015-06-16 HISTORY — DX: Major depressive disorder, single episode, unspecified: F32.9

## 2015-06-16 LAB — CBC
HCT: 40.5 % (ref 39.0–52.0)
Hemoglobin: 14.2 g/dL (ref 13.0–17.0)
MCH: 31.4 pg (ref 26.0–34.0)
MCHC: 35.1 g/dL (ref 30.0–36.0)
MCV: 89.6 fL (ref 78.0–100.0)
Platelets: 262 10*3/uL (ref 150–400)
RBC: 4.52 MIL/uL (ref 4.22–5.81)
RDW: 13 % (ref 11.5–15.5)
WBC: 5.6 10*3/uL (ref 4.0–10.5)

## 2015-06-16 LAB — COMPREHENSIVE METABOLIC PANEL
ALBUMIN: 4.2 g/dL (ref 3.5–5.0)
ALT: 54 U/L (ref 17–63)
ANION GAP: 7 (ref 5–15)
AST: 30 U/L (ref 15–41)
Alkaline Phosphatase: 137 U/L — ABNORMAL HIGH (ref 38–126)
BUN: 11 mg/dL (ref 6–20)
CO2: 30 mmol/L (ref 22–32)
CREATININE: 1.22 mg/dL (ref 0.61–1.24)
Calcium: 9.6 mg/dL (ref 8.9–10.3)
Chloride: 103 mmol/L (ref 101–111)
GFR calc Af Amer: >60 mL/min (ref >60)
Glucose, Bld: 116 mg/dL — ABNORMAL HIGH (ref 65–99)
POTASSIUM: 4 mmol/L (ref 3.5–5.1)
SODIUM: 140 mmol/L (ref 135–145)
TOTAL PROTEIN: 7.7 g/dL (ref 6.5–8.1)
Total Bilirubin: 0.6 mg/dL (ref 0.3–1.2)

## 2015-06-16 LAB — RAPID URINE DRUG SCREEN, HOSP PERFORMED
Amphetamines: NOT DETECTED
Barbiturates: NOT DETECTED
Benzodiazepines: NOT DETECTED
COCAINE: POSITIVE — AB
OPIATES: NOT DETECTED
Tetrahydrocannabinol: NOT DETECTED

## 2015-06-16 LAB — ETHANOL: Alcohol, Ethyl (B): <5 mg/dL (ref <5)

## 2015-06-16 MED ORDER — ONDANSETRON HCL 4 MG PO TABS: 4.0000 mg | ORAL_TABLET | Freq: Three times a day (TID) | ORAL | Status: DC | PRN

## 2015-06-16 MED ORDER — IBUPROFEN 400 MG PO TABS: 600.0000 mg | ORAL_TABLET | Freq: Three times a day (TID) | ORAL | Status: DC | PRN

## 2015-06-16 MED ORDER — NICOTINE 21 MG/24HR TD PT24: 21.0000 mg | MEDICATED_PATCH | Freq: Every day | TRANSDERMAL | Status: DC

## 2015-06-16 MED ORDER — ZOLPIDEM TARTRATE 5 MG PO TABS: 5.0000 mg | ORAL_TABLET | Freq: Every evening | ORAL | Status: DC | PRN

## 2015-06-16 MED ORDER — ACETAMINOPHEN 325 MG PO TABS: 650.0000 mg | ORAL_TABLET | ORAL | Status: DC | PRN

## 2015-06-16 NOTE — ED Notes (Signed)
Pt. reports suicidal ideation with visual hallucinations " shadows" and insomnia for 3 days . Pt. did not disclose plan of suicide.
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
## 2015-06-16 NOTE — ED Notes (Signed)
Inventoried patient's belongings, including 1 pair pant, 1 shirt, shoes and socks, 1 copy "hunger Games" and wallet including [place] DL and 1 VISA. Wallet placed with security. Clothing in dirty utility room.

## 2015-06-16 NOTE — ED Notes (Signed)
Pt. wanded by security , sitter with pt.

## 2015-06-16 NOTE — ED Notes (Signed)
Staffing office notified for pt.'s sitter , security notified to wand pt. Paper scrubs given to pt. at triage .

## 2015-06-17 MED ORDER — NON FORMULARY: 4.0000 mg | Freq: Every day | Status: DC

## 2015-06-17 MED ORDER — ESZOPICLONE 1 MG PO TABS
4.0000 mg | ORAL_TABLET | Freq: Every day | ORAL | Status: DC
  Administered 2015-06-17: 4 mg via ORAL
  Filled 2015-06-17: qty 4

## 2015-06-17 MED ORDER — CLONAZEPAM 0.5 MG PO TABS
1.0000 mg | ORAL_TABLET | Freq: Two times a day (BID) | ORAL | Status: DC | PRN
  Administered 2015-06-17: 1 mg via ORAL
  Filled 2015-06-17: qty 2

## 2015-06-17 NOTE — ED Notes (Signed)
Pt reports feeling "super anxious" on assessment this am. Denies suicide and denies visual hallucinations; states he does not feel "as tired" as he did yesterday. Pt speech is pressured and rapid with stuttering. Appears restless in bed. Does appear anxious. Will speak with Dr. Radford Pax regarding treatment for this. Pt states he takes daily xanax for his anxiety and PTSD and has not taken it in 3 days. Pt otherwise a/o x4 in NAD.

## 2015-06-17 NOTE — ED Notes (Signed)
Pt sleeping. Did not wake pt to offer snack due to pt stating he had not slept in 3 days.

## 2015-06-17 NOTE — Progress Notes (Signed)
Pt accepted to [facility] by Dr. Loyola Mast per Kathlene November (intake). Report number is [phone]. Pt can be transported when ready, admission is voluntary.  Ilean Skill, MSW, LCSWA Clinical Social Work, Disposition  06/17/2015 [phone]

## 2015-06-17 NOTE — Progress Notes (Addendum)
Spoke with Tresa Endo, transfer coordinator with [facility], who states pt has not accessed [place] services before (he reportedly is in process of instating benefits) and therefore would not be eligible for direct transfer to [place] today. Reports no bed availability currently, regardless. Tresa Endo provided the following contact numbers to be provided to patient if assistance is needed completing his benefits enrollment paperwork: [phone], ex: [phone], ex: [phone], or ex: [phone].  Seeking inpatient psych placement for patient. Also considered for [facility] admission upon bed availability.   Referrals made: [facility]- per [facility]- per Aram Beecham (for waitlist) [facility]- per Christian[facility] Ha (no beds currently but refer in case of d/cs) [facility]- per [facility]- will review per [place]  Left voicemail with [facility] and will refer if there is availability.  Ilean Skill, MSW, LCSWA Clinical Social Work, Disposition  06/17/2015 [phone]

## 2015-06-17 NOTE — ED Provider Notes (Signed)
CSN: [id number]     Arrival date & time 06/16/15  1941 History   First MD Initiated Contact with Patient 06/16/15 2210     Chief Complaint  Patient presents with  . Suicidal     (Consider location/radiation/quality/duration/timing/severity/associated sxs/prior Treatment) HPI Comments: Patient presents from [facility] with suicidal thoughts since this morning. He states he is feeling better since arriving here. Reports he has been there for opiate detox has not used opiates in 12 days. Patient states he is having thoughts of wine hang himself because of insomnia. States he's been seen "shadows and spirits" they have been confusing him. He denies any homicidal thoughts. Denies hearing any voices. Denies any illicit drug or alcohol use. Denies any IV drug abuse. Last used opiates 12 days ago. He is prescribed Lunesta and no other medications. Denies any head, chest, back, neck or abdominal pain.  The history is provided by the patient.    Past Medical History  Diagnosis Date  . PTSD (post-traumatic stress disorder)   . Chronic leg pain   . ADD (attention deficit disorder)   . Anxiety   . Insomnia   . Depression    History reviewed. No pertinent past surgical history. No family history on file. History  Substance Use Topics  . Smoking status: Never Smoker   . Smokeless tobacco: Not on file  . Alcohol Use: Yes    Review of Systems  Constitutional: Negative for fever, activity change, appetite change and fatigue.  HENT: Negative for congestion and rhinorrhea.   Eyes: Negative for visual disturbance.  Respiratory: Negative for cough, chest tightness and shortness of breath.   Gastrointestinal: Negative for nausea, vomiting and abdominal pain.  Genitourinary: Negative for dysuria, urgency, hematuria and genital sores.  Musculoskeletal: Negative for myalgias and arthralgias.  Skin: Negative for rash.  Neurological: Negative for dizziness, weakness and headaches.   Psychiatric/Behavioral: Positive for suicidal ideas and decreased concentration. The patient is nervous/anxious and is hyperactive.   A complete 10 system review of systems was obtained and all systems are negative except as noted in the HPI and PMH.      Allergies  Review of patient's allergies indicates no known allergies.  Home Medications   Prior to Admission medications   Medication Sig Start Date End Date Taking? Authorizing Provider  cephALEXin (KEFLEX) 500 MG capsule Take 1 capsule (500 mg total) by mouth 3 (three) times daily. Patient not taking: Reported on 06/03/2015 01/15/15   Tatyana Kirichenko, PA-C  clonazePAM (KLONOPIN) 0.5 MG tablet Take 2 tablets (1 mg total) by mouth 2 (two) times daily as needed for anxiety. Patient not taking: Reported on 06/03/2015 01/15/14   Ruby Cola, PA-C  Eszopiclone (ESZOPICLONE) 3 MG TABS Take 2 tablets (6 mg total) by mouth at bedtime. Take immediately before bedtime Patient not taking: Reported on 06/14/2015 01/15/14   Ruby Cola, PA-C  HYDROcodone-acetaminophen (NORCO) 5-325 MG per tablet Take 1-2 tablets by mouth every 6 (six) hours as needed for severe pain. Patient not taking: Reported on 06/03/2015 04/10/15   Mercedes Camprubi-Soms, PA-C  ibuprofen (ADVIL,MOTRIN) 600 MG tablet Take 1 tablet (600 mg total) by mouth every 6 (six) hours as needed. Patient not taking: Reported on 06/03/2015 01/15/15   Jaynie Crumble, PA-C  methylphenidate (RITALIN) 10 MG tablet Take 1 tablet (10 mg total) by mouth 4 (four) times daily. Patient not taking: Reported on 06/03/2015 01/15/14   Ruby Cola, PA-C  naproxen (NAPROSYN) 500 MG tablet Take 1 tablet (500 mg total) by  mouth 2 (two) times daily as needed for mild pain, moderate pain or headache (TAKE WITH MEALS.). Patient not taking: Reported on 06/14/2015 04/10/15   Mercedes Camprubi-Soms, PA-C  oxyCODONE-acetaminophen (PERCOCET) 5-325 MG per tablet Take 1 tablet by mouth every 4  (four) hours as needed for moderate pain. Patient not taking: Reported on 06/03/2015 02/04/15   Dione Booze, MD  sulfamethoxazole-trimethoprim (BACTRIM DS,SEPTRA DS) 800-160 MG per tablet Take 1 tablet by mouth 2 (two) times daily. Patient not taking: Reported on 06/03/2015 04/10/15   Mercedes Camprubi-Soms, PA-C  temazepam (RESTORIL) 30 MG capsule Take 1 capsule (30 mg total) by mouth at bedtime. Patient not taking: Reported on 06/03/2015 01/15/14   Ruby Cola, PA-C   BP 139/61 mmHg  Pulse 71  Temp(Src) 98.6 F (37 C) (Oral)  Resp 14  Ht  (1.753 m)  Wt 165 lb (74.844 kg)  BMI 24.36 kg/m2  SpO2 100% Physical Exam  Constitutional: He is oriented to person, place, and time. He appears well-developed and well-nourished. No distress.  HENT:  Head: Normocephalic and atraumatic.  Mouth/Throat: Oropharynx is clear and moist. No oropharyngeal exudate.  Eyes: Conjunctivae and EOM are normal. Pupils are equal, round, and reactive to light.  Neck: Normal range of motion. Neck supple.  No meningismus.  Cardiovascular: Normal rate, regular rhythm, normal heart sounds and intact distal pulses.   No murmur heard. Pulmonary/Chest: Effort normal and breath sounds normal. No respiratory distress. He exhibits no tenderness.  Abdominal: Soft. There is no tenderness. There is no rebound and no guarding.  Musculoskeletal: Normal range of motion. He exhibits no edema or tenderness.  Neurological: He is alert and oriented to person, place, and time. No cranial nerve deficit. He exhibits normal muscle tone. Coordination normal.  No ataxia on finger to nose bilaterally. No pronator drift. 5/5 strength throughout. CN 2-12 intact. Negative Romberg. Equal grip strength. Sensation intact. Gait is normal.   Skin: Skin is warm.  Psychiatric: He has a normal mood and affect. His behavior is normal.  Nursing note and vitals reviewed.   ED Course  Procedures (including critical care time) Labs  Review Labs Reviewed  COMPREHENSIVE METABOLIC PANEL - Abnormal; Notable for the following:    Glucose, Bld 116 (*)    Alkaline Phosphatase 137 (*)    All other components within normal limits  URINE RAPID DRUG SCREEN, HOSP PERFORMED - Abnormal; Notable for the following:    Cocaine POSITIVE (*)    All other components within normal limits  ETHANOL  CBC    Imaging Review No results found.   EKG Interpretation None      MDM   Final diagnoses:  Suicidal ideation   patient was suicidal thoughts with plan to hang himself. Denies any homicidal thoughts. Denies hearing any voices.  Neurological exam is nonfocal. Screening labs are negative.  Patient is medically clear for psychiatric evaluation.    Glynn Octave, MD 06/17/15 [phone]

## 2015-06-17 NOTE — ED Notes (Signed)
Pt transporting to [facility] with [employer]. NAD. Ambulatory with steady gait. A/O x4 on departure.

## 2015-08-27 IMAGING — CR DG HAND COMPLETE 3+V*R*
3 series · 3 of 3 positions shown · non-contrast
Comparison: None.

CLINICAL DATA: Pt states that he dropped a 50 pound weight onto his
right hand this evening, right hand pains with swelling mostly over
the 3rd MCP

EXAM:
RIGHT HAND - COMPLETE 3+ VIEW

[x hand pa right]
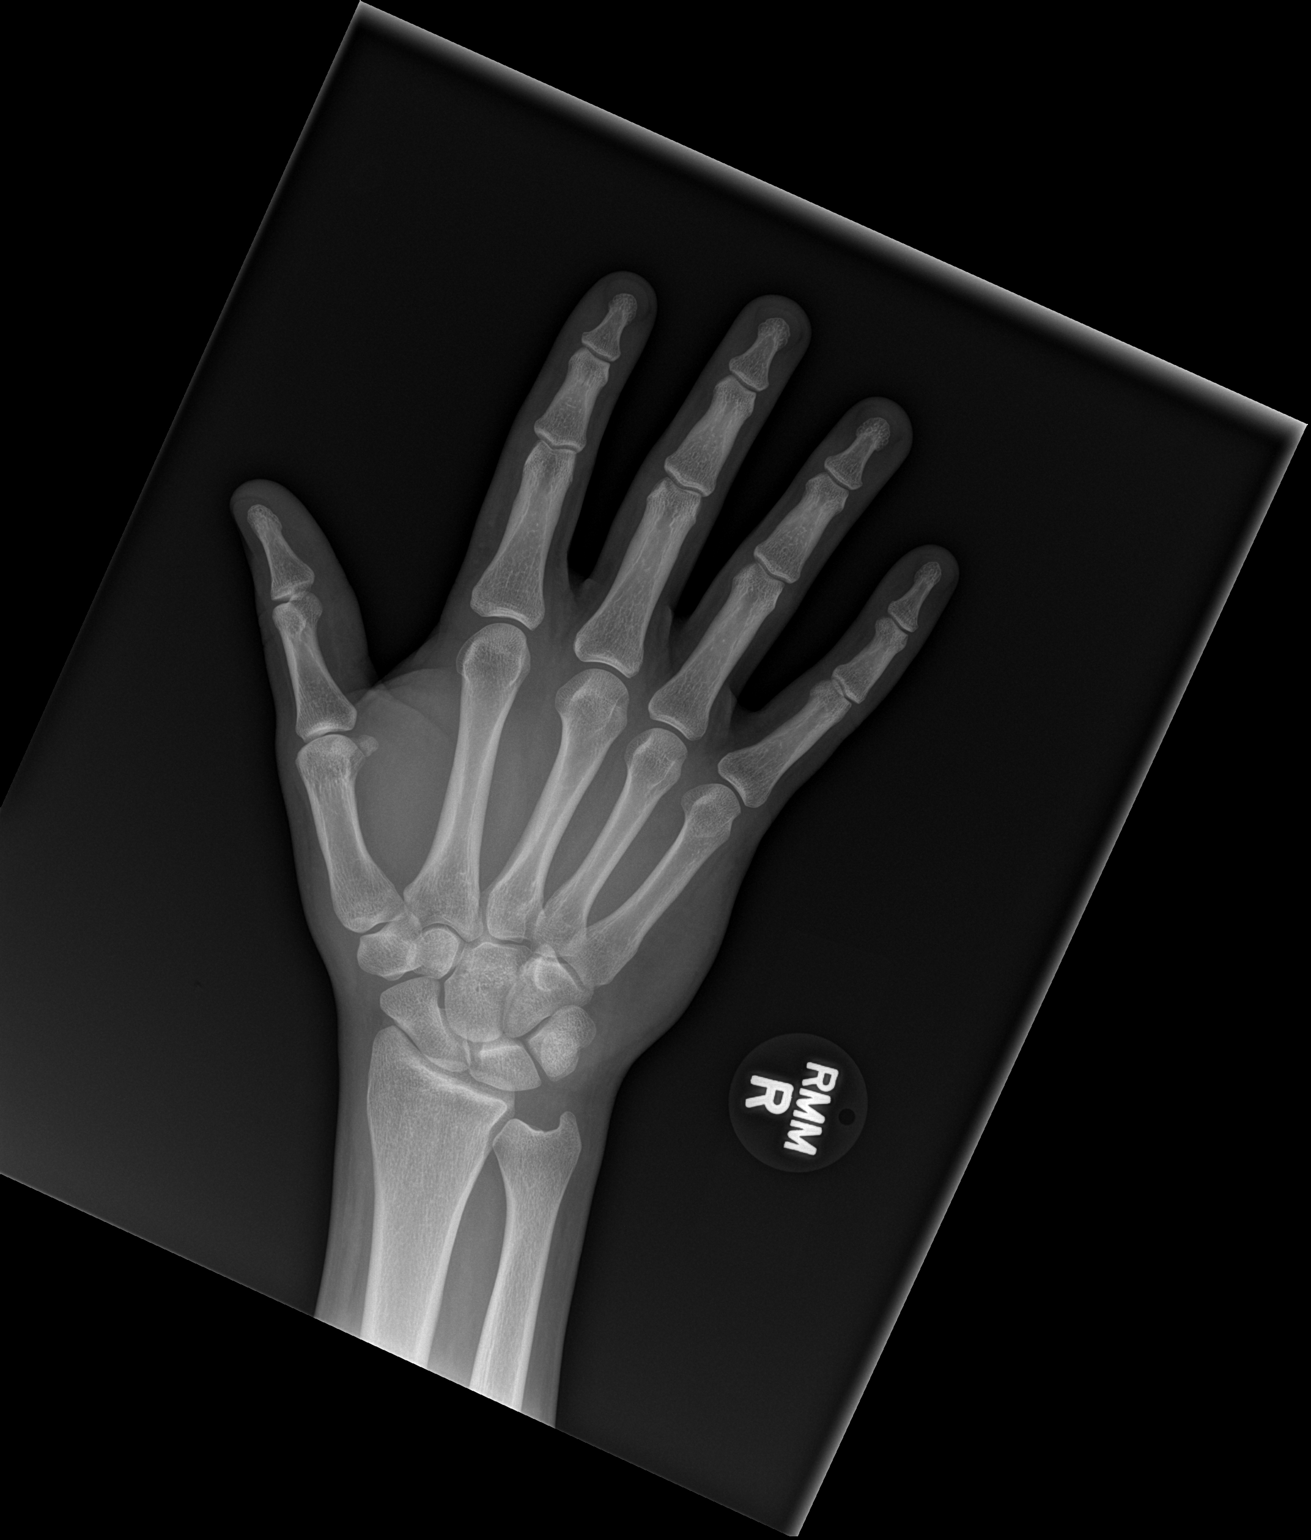

[x hand obl right]
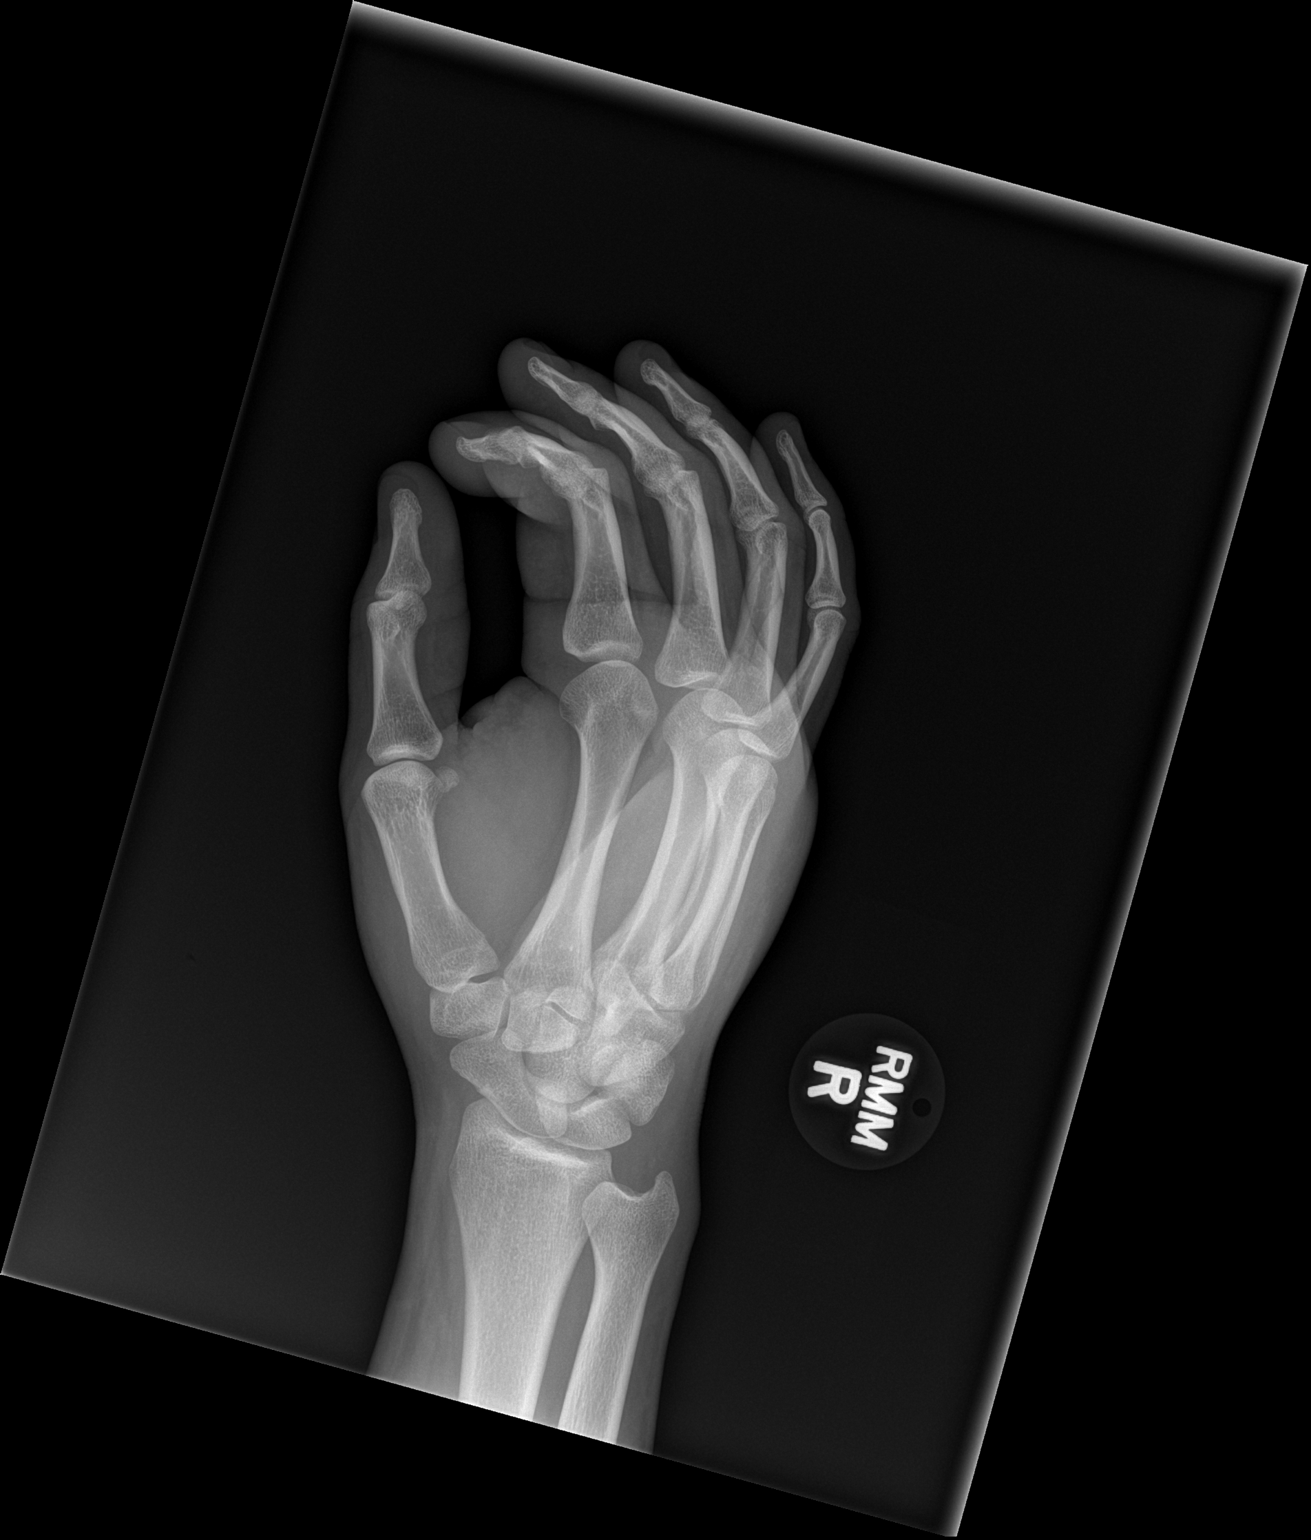

[x hand lat right]
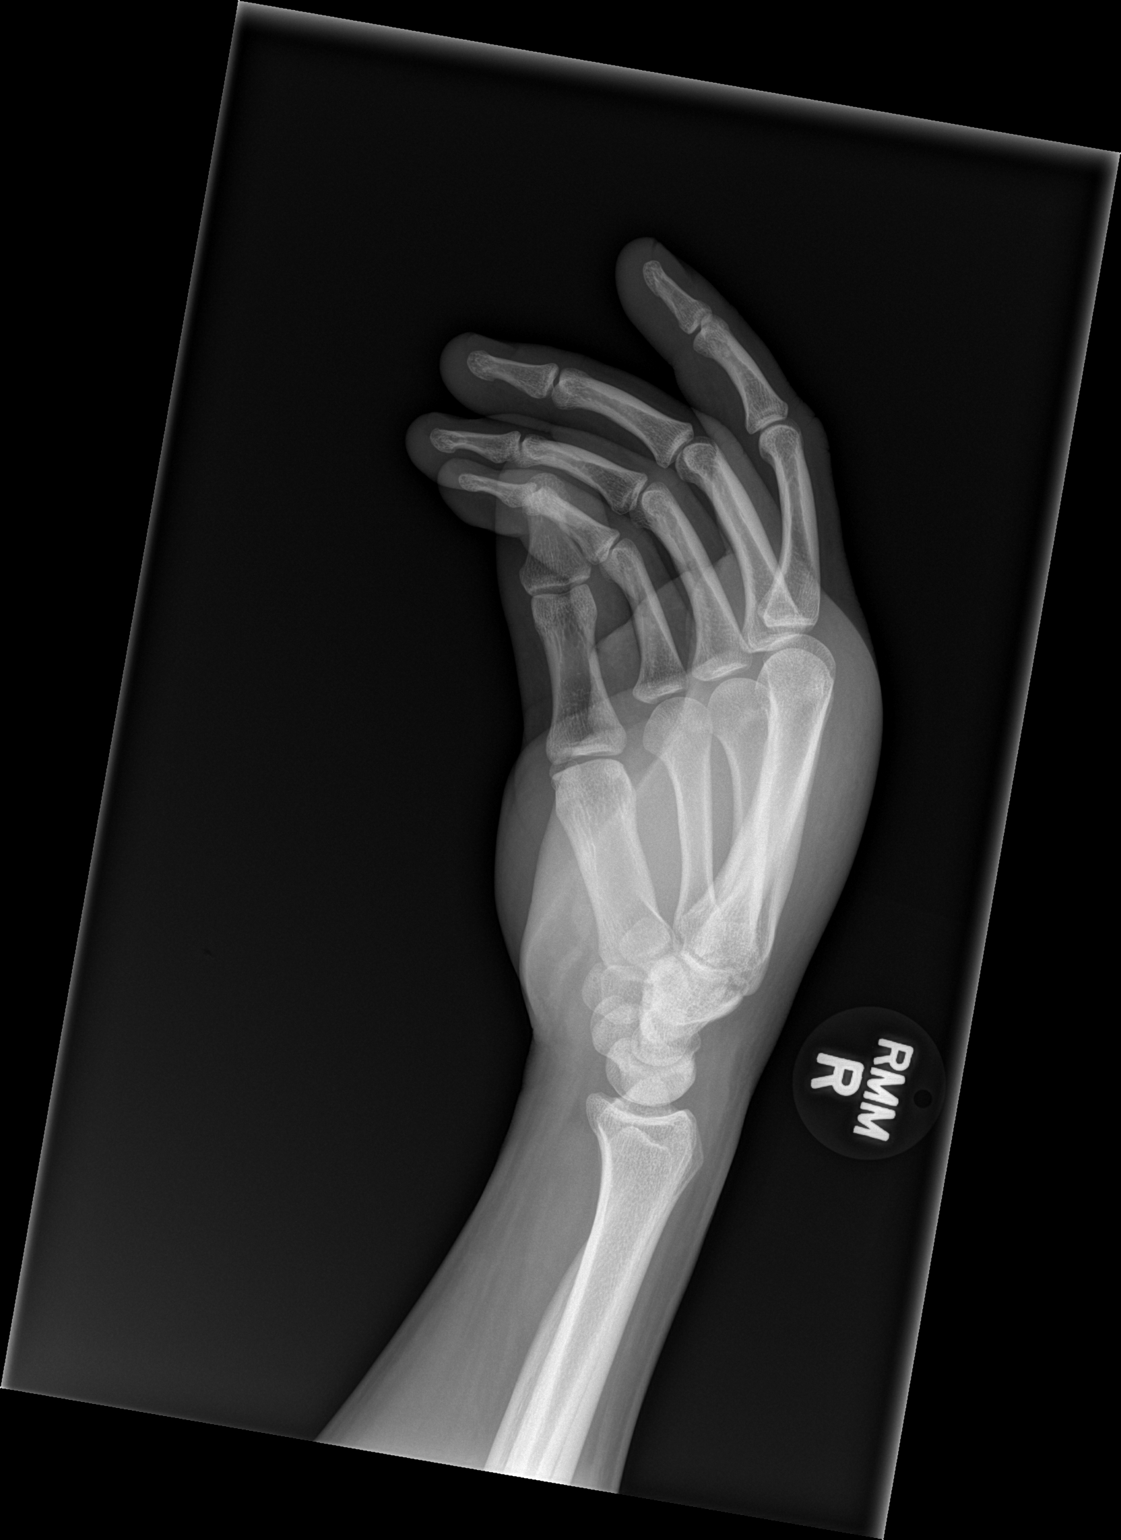

[3 of 3 positions shown; findings below may reference images not displayed]

FINDINGS: No fracture. Joints normally spaced and aligned. No arthropathic
change.

There is dorsal soft tissue swelling. No soft tissue air. No
radiopaque foreign body.
IMPRESSION: Dorsal soft tissue swelling.  No fracture or dislocation.
